# Patient Record
Sex: Male | Born: 1960 | Race: White | Hispanic: No | Marital: Married | Smoking: Never smoker
Health system: Southern US, Community
[De-identification: ages and names within clinical notes are randomized; demographics above are authoritative.]

## PROBLEM LIST (undated history)

## (undated) DIAGNOSIS — M79673 Pain in unspecified foot: Secondary | ICD-10-CM

## (undated) HISTORY — DX: Pain in unspecified foot: M79.673

---

## 2010-06-24 ENCOUNTER — Ambulatory Visit (INDEPENDENT_AMBULATORY_CARE_PROVIDER_SITE_OTHER): Payer: Commercial Managed Care - PPO | Admitting: Family Medicine

## 2010-06-24 ENCOUNTER — Encounter: Payer: Self-pay | Admitting: Family Medicine

## 2010-06-24 DIAGNOSIS — J020 Streptococcal pharyngitis: Secondary | ICD-10-CM

## 2010-06-24 DIAGNOSIS — Z1211 Encounter for screening for malignant neoplasm of colon: Secondary | ICD-10-CM

## 2010-06-29 NOTE — Assessment & Plan Note (Signed)
Summary: New Patient/dt   Vital Signs:  Patient profile:   50 year old male Height:      71.5 inches Weight:      180.50 pounds BMI:     24.91 O2 Sat:      99 % on Room air Temp:     97.7 degrees F oral Pulse rate:   67 / minute BP sitting:   115 / 74  (right arm) Cuff size:   large  Vitals Entered By: Francee Piccolo CMA Duncan Dull) (June 24, 2010 11:18 AM)  O2 Flow:  Room air CC: new to establish Is Patient Diabetic? No   History of Present Illness: 50 y/o WM here to establish care. Also had recent sore throat illness for a few days--all symptoms have resolved now. No cough or URI symptoms with it.  Subjective fever on first day or two.  Daughter had + strep pharyngitis last week.   He has been taking amoxil 500mg  three times a day for the last 3d.  Preventive Screening-Counseling & Management  Alcohol-Tobacco     Alcohol drinks/day: 0     Smoking Status: never  Current Medications (verified): 1)  Fish Oil 1000 Mg Caps (Omega-3 Fatty Acids) .... Take 1 Capsule By Mouth Once A Day  Allergies (verified): No Known Drug Allergies  Past History:  Past Medical History: No significant problems.  Past Surgical History: NONE  Family History: Dad: CVA, AAA MOM: A-fib, CHF, COPD, glaucoma  Social History: Married, 3 daughters. Occupation: Clinical research associate. No T/A/Ds.  Smoking Status:  never  Review of Systems  The patient denies anorexia, fever, weight loss, weight gain, vision loss, decreased hearing, hoarseness, chest pain, syncope, dyspnea on exertion, peripheral edema, prolonged cough, headaches, hemoptysis, abdominal pain, melena, hematochezia, severe indigestion/heartburn, hematuria, incontinence, genital sores, muscle weakness, suspicious skin lesions, transient blindness, difficulty walking, depression, unusual weight change, abnormal bleeding, enlarged lymph nodes, angioedema, breast masses, and testicular masses.    Physical Exam  General:  VS: noted, all  normal. Gen: Alert, well appearing, oriented x 4. HEENT: Scalp without lesions or hair loss.  Ears: EACs clear, normal epithelium.  TMs with good light reflex and landmarks bilaterally.  Eyes: no injection, icteris, swelling, or exudate.  EOMI, PERRLA. Nose: no drainage or turbinate edema/swelling.  No injection or focal lesion.  Mouth: lips without lesion/swelling.  Oral mucosa pink and moist.  Dentition intact and without obvious caries or gingival swelling.  Oropharynx without erythema, exudate, or swelling.  Neck: supple.  No lymphadenopathy, thyromegaly, or mass. Chest: symmetric expansion, with nonlabored respirations.  Clear and equal breath sounds in all lung fields.   CV: RRR, no m/r/g.  Peripheral pulses 2+/symmetric. EXT: no clubbing, cyanosis, or edema.     Impression & Recommendations:  Problem # 1:  STREPTOCOCCAL PHARYNGITIS (ICD-034.0) Assessment New Complete 10d course of abx.  His updated medication list for this problem includes:    Amoxicillin 875 Mg Tabs (Amoxicillin) .Marland Kitchen... 1 tab by mouth two times a day x 7d  Problem # 2:  SCREENING, COLON CANCER (ICD-V76.51) Assessment: New  Will arrange GI referral for routine screening.  Orders: Gastroenterology Referral (GI)  Complete Medication List: 1)  Fish Oil 1000 Mg Caps (Omega-3 fatty acids) .... Take 1 capsule by mouth once a day 2)  Amoxicillin 875 Mg Tabs (Amoxicillin) .Marland Kitchen.. 1 tab by mouth two times a day x 7d  Patient Instructions: 1)  Please arrange appt for CPE at your convenience. Prescriptions: AMOXICILLIN 875 MG TABS (AMOXICILLIN) 1  tab by mouth two times a day x 7d  #14 x 1   Entered and Authorized by:   Michell Heinrich M.D.   Signed by:   Michell Heinrich M.D. on 06/24/2010   Method used:   Electronically to        [facility]* (retail)       [address]       [address] [employer]       [place], [place]  [address]       Ph: [phone]       Fax:  [phone]   RxID:   [id number]    Orders Added: 1)  New Patient Level III [84132] 2)  Gastroenterology Referral [GI]

## 2010-07-28 ENCOUNTER — Ambulatory Visit (AMBULATORY_SURGERY_CENTER): Payer: 59 | Admitting: *Deleted

## 2010-07-28 VITALS — Ht 72.0 in | Wt 180.7 lb

## 2010-07-28 DIAGNOSIS — Z1211 Encounter for screening for malignant neoplasm of colon: Secondary | ICD-10-CM

## 2010-07-28 MED ORDER — PEG-KCL-NACL-NASULF-NA ASC-C 100 G PO SOLR: 1.0000 | Freq: Once | ORAL | Status: AC

## 2010-08-02 ENCOUNTER — Telehealth: Payer: Self-pay | Admitting: Gastroenterology

## 2010-08-02 ENCOUNTER — Encounter: Payer: Self-pay | Admitting: Gastroenterology

## 2010-08-02 NOTE — Telephone Encounter (Signed)
At 11:10am contacted patient and he stated that he had received the wrong prep from the pharmacy.  He has since received the right prep and is okay.

## 2010-08-02 NOTE — Telephone Encounter (Signed)
Patient wanted to make sure he had correct prep ordered.  Answered questions appropriately.

## 2010-08-03 ENCOUNTER — Ambulatory Visit (AMBULATORY_SURGERY_CENTER): Payer: 59 | Admitting: Gastroenterology

## 2010-08-03 ENCOUNTER — Other Ambulatory Visit: Payer: Commercial Managed Care - PPO | Admitting: Gastroenterology

## 2010-08-03 ENCOUNTER — Encounter: Payer: Self-pay | Admitting: Gastroenterology

## 2010-08-03 VITALS — BP 125/62 | HR 68 | Temp 96.8°F | Resp 18 | Ht 72.0 in | Wt 180.0 lb

## 2010-08-03 DIAGNOSIS — Z1211 Encounter for screening for malignant neoplasm of colon: Secondary | ICD-10-CM

## 2010-08-03 DIAGNOSIS — K648 Other hemorrhoids: Secondary | ICD-10-CM

## 2010-08-03 HISTORY — PX: COLONOSCOPY: SHX174

## 2010-08-03 NOTE — Patient Instructions (Signed)
Discharge instructions reviewed  Hemorrhoid education given  Next colonoscopy in 10 years  Please call if any problems

## 2010-08-03 NOTE — Progress Notes (Signed)
10:50 to bathroom to pass gas with success

## 2010-08-04 ENCOUNTER — Telehealth: Payer: Self-pay | Admitting: *Deleted

## 2010-08-04 NOTE — Telephone Encounter (Signed)

## 2010-09-06 ENCOUNTER — Encounter: Payer: Self-pay | Admitting: Family Medicine

## 2010-09-10 ENCOUNTER — Encounter: Payer: Self-pay | Admitting: Family Medicine

## 2010-12-15 NOTE — Progress Notes (Signed)
Addended by: Virgel Paling on: 12/15/2010 12:08 PM   Modules accepted: Level of Service

## 2011-01-19 ENCOUNTER — Other Ambulatory Visit (INDEPENDENT_AMBULATORY_CARE_PROVIDER_SITE_OTHER): Payer: 59

## 2011-01-19 ENCOUNTER — Telehealth: Payer: Self-pay | Admitting: *Deleted

## 2011-01-19 DIAGNOSIS — M545 Low back pain, unspecified: Secondary | ICD-10-CM

## 2011-01-19 DIAGNOSIS — R300 Dysuria: Secondary | ICD-10-CM

## 2011-01-19 LAB — URINALYSIS
Bilirubin Urine: NEGATIVE
Nitrite: NEGATIVE
Specific Gravity, Urine: 1.01 (ref 1.000–1.030)
Total Protein, Urine: NEGATIVE
pH: 6.5 (ref 5.0–8.0)

## 2011-01-19 LAB — BASIC METABOLIC PANEL
BUN: 16 mg/dL (ref 6–23)
Chloride: 103 mEq/L (ref 96–112)
Glucose, Bld: 89 mg/dL (ref 70–99)
Potassium: 4.4 mEq/L (ref 3.5–5.1)
Sodium: 139 mEq/L (ref 135–145)

## 2011-01-19 LAB — CBC WITH DIFFERENTIAL/PLATELET
Basophils Absolute: 0 10*3/uL (ref 0.0–0.1)
Basophils Relative: 0.3 % (ref 0.0–3.0)
Eosinophils Relative: 1.8 % (ref 0.0–5.0)
HCT: 44.3 % (ref 39.0–52.0)
Hemoglobin: 14.7 g/dL (ref 13.0–17.0)
Lymphocytes Relative: 27.7 % (ref 12.0–46.0)
Lymphs Abs: 2.4 10*3/uL (ref 0.7–4.0)
Monocytes Relative: 7.9 % (ref 3.0–12.0)
Neutro Abs: 5.4 10*3/uL (ref 1.4–7.7)
RBC: 4.84 Mil/uL (ref 4.22–5.81)
WBC: 8.7 10*3/uL (ref 4.5–10.5)

## 2011-01-19 LAB — PSA: PSA: 0.43 ng/mL (ref 0.10–4.00)

## 2011-01-19 NOTE — Telephone Encounter (Signed)
PC from pt c/o low back pain and dysuria.  Per Dr. Milinda Cave verbal OK given to order u/a, C&S, Renal, CBC, PSA.  DX back pain and dysuria.

## 2011-01-19 NOTE — Telephone Encounter (Signed)
Noted/agree--PM

## 2011-02-14 ENCOUNTER — Ambulatory Visit (INDEPENDENT_AMBULATORY_CARE_PROVIDER_SITE_OTHER): Payer: 59

## 2011-02-14 DIAGNOSIS — Z23 Encounter for immunization: Secondary | ICD-10-CM

## 2011-04-11 ENCOUNTER — Other Ambulatory Visit: Payer: Self-pay | Admitting: Family Medicine

## 2011-04-11 DIAGNOSIS — G4710 Hypersomnia, unspecified: Secondary | ICD-10-CM

## 2011-05-24 ENCOUNTER — Ambulatory Visit (INDEPENDENT_AMBULATORY_CARE_PROVIDER_SITE_OTHER): Payer: 59 | Admitting: Family Medicine

## 2011-05-24 ENCOUNTER — Encounter: Payer: Self-pay | Admitting: Family Medicine

## 2011-05-24 VITALS — BP 125/79 | HR 60 | Temp 97.9°F | Wt 181.0 lb

## 2011-05-24 DIAGNOSIS — G4733 Obstructive sleep apnea (adult) (pediatric): Secondary | ICD-10-CM

## 2011-05-24 DIAGNOSIS — J029 Acute pharyngitis, unspecified: Secondary | ICD-10-CM

## 2011-05-24 LAB — POCT RAPID STREP A (OFFICE): Rapid Strep A Screen: NEGATIVE

## 2011-05-24 MED ORDER — AMOXICILLIN-POT CLAVULANATE 875-125 MG PO TABS: 1.0000 | ORAL_TABLET | Freq: Two times a day (BID) | ORAL | Status: AC

## 2011-05-24 MED ORDER — PREDNISONE 20 MG PO TABS: ORAL_TABLET | ORAL | Status: DC

## 2011-05-24 NOTE — Progress Notes (Signed)
OFFICE NOTE  05/24/2011  CC:  Chief Complaint  Patient presents with  . Sore Throat     HPI: Patient is a 51 y.o. Caucasian male who is here for sore throat. Pt presents complaining of respiratory symptoms for 1-2  days.  Mostly nasal congestion/runny nose, sore throat, woke up with feeling of strangling in throat this am and ST worse.  Worst symptoms seems to be the ST.  Lately the symptoms seem to be worsening.  No itching. No fevers, no wheezing, and no SOB.  No pain in face or teeth.  NO HA. Symptoms made worse by nothing.  Symptoms improved by nothing. Smoker? no Recent sick contact? Yes, daughter sick last week with fever/resp illness. Muscle or joint aches? Mild, with mild fatigue. Flu shot this season at least 2 wks ago? yes  ROS: no n/v/d or abdominal pain.  No rash.  No neck stiffness.   +Mild fatigue.  +Mild appetite loss.  He did have a FLI around christmas time, had a brief post-viral exanthem after this illness.  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  MEDS:  NO MEDS RECENTLY Outpatient Prescriptions Prior to Visit  Medication Sig Dispense Refill  . Omega-3 Fatty Acids (FISH OIL) 1000 MG CAPS Take 1 capsule by mouth daily.          PE: Blood pressure 125/79, pulse 60, temperature 97.9 F (36.6 C), temperature source Temporal, weight 181 lb (82.101 kg), SpO2 99.00%. VS: noted--normal. Gen: alert, NAD, NONTOXIC APPEARING. HEENT: eyes without injection, drainage, or swelling.  Ears: EACs clear, TMs with normal light reflex and landmarks.  Nose: Clear rhinorrhea, with some dried, crusty exudate adherent to mildly injected mucosa.  No purulent d/c.  No paranasal sinus TTP.  No facial swelling.  Throat and mouth without focal lesion.   Marked uvula swelling and erythema, tonsils avg size, symmetric, mild erythema.  Neck: supple, mild nontender jugulodigastric LAD.   LUNGS: CTA bilat, nonlabored resps.   CV:  RRR, no m/r/g. EXT: no c/c/e SKIN: no rash  LAB: rapid strep neg  IMPRESSION AND PLAN: Acute pharyngitis, marked uvular edema with patient having sense of strangulation/hot potato voice. Strep neg.  Sent culture. Empiric augmentin 875mg  bid x 10d. Empiric prednisone 60mg  qd x 2d, then 40mg  qd x 2d, then 20mg  qd x 2d. Antihistamines and NSAIDs discussed.  Will arrange home unattended sleep study to further evaluate his witnessed sleep apnea.  FOLLOW UP: prn

## 2011-05-24 NOTE — Progress Notes (Signed)
Addended by: Baldemar Lenis R on: 05/24/2011 09:30 AM   Modules accepted: Orders

## 2011-05-26 LAB — CULTURE, GROUP A STREP: Organism ID, Bacteria: NORMAL

## 2011-06-21 ENCOUNTER — Encounter: Payer: Self-pay | Admitting: Family Medicine

## 2011-09-28 ENCOUNTER — Ambulatory Visit (INDEPENDENT_AMBULATORY_CARE_PROVIDER_SITE_OTHER): Payer: 59 | Admitting: Family Medicine

## 2011-09-28 ENCOUNTER — Encounter: Payer: Self-pay | Admitting: Family Medicine

## 2011-09-28 VITALS — BP 114/78 | HR 60 | Temp 97.9°F | Wt 185.0 lb

## 2011-09-28 DIAGNOSIS — L989 Disorder of the skin and subcutaneous tissue, unspecified: Secondary | ICD-10-CM

## 2011-09-28 NOTE — Progress Notes (Signed)
OFFICE NOTE  09/28/2011  CC:  Chief Complaint  Patient presents with  . rough area on face     HPI: Patient is a 51 y.o. Caucasian male who is here for spot on face.  Has been there x approx 6 mo, seems to come and go, describes it as slightly flaky surface lesion approx dime sized at the most. Nothing there currently.  No itching or pain.  NO hx of skin malignancy or similar skin lesion in the past.   Pertinent PMH:  History reviewed. No pertinent past medical history.  MEDS: Omega 3 fatty acids 1000mg   PE: Blood pressure 114/78, pulse 60, temperature 97.9 F (36.6 C), temperature source Temporal, weight 185 lb (83.915 kg). Gen: Alert, well appearing.  Patient is oriented to person, place, time, and situation. Skin: face with no worrisom lesions, no hyperkeratosis, no hypervascularity, no hypertrophy of skin, no comedonal or pustular lesions.  Benign-appearing flesh colored nevus on lateral right cheek about 2 mm in size  IMPRESSION AND PLAN: Facial skin lesion, possibly actinic keratosis. Nothing visible there today. Plan is to continue monitoring for now.  Consideration given to topical agent like solaraze gel (diclofenac 3%) for 90d vs imiquimod vs cryotherapy if lesion returns more prominently in future. FOLLOW UP: prn

## 2012-02-15 ENCOUNTER — Ambulatory Visit (INDEPENDENT_AMBULATORY_CARE_PROVIDER_SITE_OTHER): Payer: 59

## 2012-02-15 DIAGNOSIS — Z23 Encounter for immunization: Secondary | ICD-10-CM

## 2013-03-16 ENCOUNTER — Ambulatory Visit (INDEPENDENT_AMBULATORY_CARE_PROVIDER_SITE_OTHER): Payer: 59 | Admitting: Family Medicine

## 2013-03-16 DIAGNOSIS — Z23 Encounter for immunization: Secondary | ICD-10-CM

## 2013-06-04 ENCOUNTER — Ambulatory Visit: Payer: 59 | Admitting: Family Medicine

## 2013-06-07 ENCOUNTER — Ambulatory Visit (INDEPENDENT_AMBULATORY_CARE_PROVIDER_SITE_OTHER): Payer: 59 | Admitting: Family Medicine

## 2013-06-07 ENCOUNTER — Encounter: Payer: Self-pay | Admitting: Family Medicine

## 2013-06-07 VITALS — BP 118/84 | HR 78 | Temp 98.8°F | Resp 18 | Ht 71.75 in | Wt 176.0 lb

## 2013-06-07 DIAGNOSIS — Z0000 Encounter for general adult medical examination without abnormal findings: Secondary | ICD-10-CM

## 2013-06-07 DIAGNOSIS — Z0389 Encounter for observation for other suspected diseases and conditions ruled out: Secondary | ICD-10-CM

## 2013-06-07 NOTE — Assessment & Plan Note (Signed)
Reviewed age and gender appropriate health maintenance issues (prudent diet, regular exercise, health risks of tobacco and excessive alcohol, use of seatbelts, fire alarms in home, use of sunscreen).  Also reviewed age and gender appropriate health screening as well as vaccine recommendations. Health panel labs + screening PSA ordered today--future/[facility]--since he was not fasting today. DRE normal today.   Discussed stress-relief measures, mainly exercise, and also reviewed possible medication options for this.  He declined meds at this time.

## 2013-06-07 NOTE — Progress Notes (Signed)
Pre visit review using our clinic review tool, if applicable. No additional management support is needed unless otherwise documented below in the visit note. 

## 2013-06-07 NOTE — Progress Notes (Signed)
Office Note 06/07/2013  CC:  Chief Complaint  Patient presents with  . Stress    HPI:  Brian Lynch is a 53 y.o. White male who is here for CPE. Describes excessive stress level, working constantly, feeling like he is being distant at home more around family --daughter commented that he was acting this way and was "different".  He denies feeling sad or depressed. No panic or rage.  Sleep: about 6 hours a night but not restful. Exercise sounds minimal due to time constraints.   History reviewed. No pertinent past medical history.  Past Surgical History  Procedure Laterality Date  . Colonoscopy  08/03/10    Normal (small internal hemorrhoids).  Repeat in 10 yrs.    Family History  Problem Relation Age of Onset  . Atrial fibrillation Mother   . Heart failure Mother   . COPD Mother   . Glaucoma Mother   . Stroke Father   . Aneurysm Father     AAA    History   Social History  . Marital Status: Married    Spouse Name: [name]    Number of Children: 3  . Years of Education: N/A   Occupational History  . Writer    Social History Main Topics  . Smoking status: Never Smoker   . Smokeless tobacco: Never Used  . Alcohol Use: 2.5 oz/week    5 drink(s) per week  . Drug Use: No  . Sexual Activity: Not on file   Other Topics Concern  . Not on file   Social History Narrative   Married, 3 children.   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   No Tob, alc, or drugs.    Outpatient Prescriptions Prior to Visit  Medication Sig Dispense Refill  . Omega-3 Fatty Acids (FISH OIL) 1000 MG CAPS Take 1 capsule by mouth daily.         No facility-administered medications prior to visit.    No Known Allergies  ROS Review of Systems  Constitutional: Negative for fever, chills, appetite change and fatigue.  HENT: Negative for congestion, dental problem, ear pain and sore throat.   Eyes: Negative for discharge, redness and visual disturbance.  Respiratory: Negative  for cough, chest tightness, shortness of breath and wheezing.   Cardiovascular: Negative for chest pain, palpitations and leg swelling.  Gastrointestinal: Negative for nausea, vomiting, abdominal pain, diarrhea and blood in stool.  Genitourinary: Negative for dysuria, urgency, frequency, hematuria, flank pain and difficulty urinating.  Musculoskeletal: Negative for arthralgias, back pain, joint swelling, myalgias and neck stiffness.  Skin: Negative for pallor and rash.  Neurological: Negative for dizziness, speech difficulty, weakness and headaches.  Hematological: Negative for adenopathy. Does not bruise/bleed easily.  Psychiatric/Behavioral: Negative for confusion and sleep disturbance. The patient is not nervous/anxious.      PE; Blood pressure 118/84, pulse 78, temperature 98.8 F (37.1 C), temperature source Temporal, resp. rate 18, height 5' 11.75" (1.822 m), weight 176 lb (79.833 kg), SpO2 100.00%. BMI 24 Gen: Alert, well appearing.  Patient is oriented to person, place, time, and situation. AFFECT: pleasant, lucid thought and speech. ENT: Ears: EACs clear, normal epithelium.  TMs with good light reflex and landmarks bilaterally.  Eyes: no injection, icteris, swelling, or exudate.  EOMI, PERRLA. Nose: no drainage or turbinate edema/swelling.  No injection or focal lesion.  Mouth: lips without lesion/swelling.  Oral mucosa pink and moist.  Dentition intact and without obvious caries or gingival swelling.  Oropharynx without erythema, exudate, or swelling.  Neck: supple/nontender.  No LAD, mass, or TM.  Carotid pulses 2+ bilaterally, without bruits. CV: RRR, no m/r/g.   LUNGS: CTA bilat, nonlabored resps, good aeration in all lung fields. ABD: soft, NT, ND, BS normal.  No hepatospenomegaly or mass.  No bruits. EXT: no clubbing, cyanosis, or edema.  Musculoskeletal: no joint swelling, erythema, warmth, or tenderness.  ROM of all joints intact. Skin - no sores or suspicious lesions or  rashes or color changes Rectal exam: negative without mass, lesions or tenderness.  Prostate is smooth, without enlargement or nodule or asymmetry.  No tenderness.  Pertinent labs:  None today  ASSESSMENT AND PLAN:   Health maintenance examination Reviewed age and gender appropriate health maintenance issues (prudent diet, regular exercise, health risks of tobacco and excessive alcohol, use of seatbelts, fire alarms in home, use of sunscreen).  Also reviewed age and gender appropriate health screening as well as vaccine recommendations. Health panel labs + screening PSA ordered today--[facility]--since he was not fasting today. DRE normal today.   Discussed stress-relief measures, mainly exercise, and also reviewed possible medication options for this.  He declined meds at this time.    An After Visit Summary was printed and given to the patient.  FOLLOW UP:  Return in about 1 year (around 06/07/2014) for annual CPE (fasting).

## 2013-10-31 ENCOUNTER — Other Ambulatory Visit: Payer: Self-pay | Admitting: Family Medicine

## 2013-10-31 MED ORDER — PERMETHRIN 1 % EX LIQD: Freq: Once | CUTANEOUS | Status: DC

## 2014-01-01 ENCOUNTER — Ambulatory Visit (INDEPENDENT_AMBULATORY_CARE_PROVIDER_SITE_OTHER)
Admission: RE | Admit: 2014-01-01 | Discharge: 2014-01-01 | Disposition: A | Discharge status: Home / Self Care | Payer: 59 | Source: Ambulatory Visit | Attending: Family Medicine | Admitting: Family Medicine

## 2014-01-01 ENCOUNTER — Other Ambulatory Visit (INDEPENDENT_AMBULATORY_CARE_PROVIDER_SITE_OTHER): Payer: 59

## 2014-01-01 ENCOUNTER — Encounter: Payer: Self-pay | Admitting: Family Medicine

## 2014-01-01 ENCOUNTER — Ambulatory Visit (INDEPENDENT_AMBULATORY_CARE_PROVIDER_SITE_OTHER): Payer: 59 | Admitting: Family Medicine

## 2014-01-01 VITALS — BP 112/82 | HR 64 | Ht 73.0 in | Wt 183.0 lb

## 2014-01-01 DIAGNOSIS — M25519 Pain in unspecified shoulder: Secondary | ICD-10-CM

## 2014-01-01 DIAGNOSIS — M25512 Pain in left shoulder: Secondary | ICD-10-CM

## 2014-01-01 DIAGNOSIS — M999 Biomechanical lesion, unspecified: Secondary | ICD-10-CM

## 2014-01-01 DIAGNOSIS — M542 Cervicalgia: Secondary | ICD-10-CM | POA: Insufficient documentation

## 2014-01-01 MED ORDER — PREDNISONE 50 MG PO TABS: 50.0000 mg | ORAL_TABLET | Freq: Every day | ORAL | Status: DC

## 2014-01-01 MED ORDER — CYCLOBENZAPRINE HCL 10 MG PO TABS: 10.0000 mg | ORAL_TABLET | Freq: Three times a day (TID) | ORAL | Status: DC | PRN

## 2014-01-01 NOTE — Patient Instructions (Addendum)
Good to see you Xrays downstairs today Prednsoine daily for 5 days Flexeril for breakthrough pain.  Ice 20 minutes 2 times daily.  Work on sleep positioning keep ing arm down and avoid fan on you. Hopefully manipulation help Start these exercises in 48 hhours.  No basketball until next week.  Come back in 2 weeks. If worse call me [phone]

## 2014-01-01 NOTE — Progress Notes (Signed)
  [name] [place], [place] [address] Phone: [phone] Subjective:     CC: Left shoulder pain  [id number] Brian Lynch is a 53 y.o. male coming in with complaint of left shoulder pain. Patient states that this started approximately 2 weeks ago. Patient states since then it seems to be getting somewhat worse. Patient does play basketball on a fairly regular basis and states that he has worsening exacerbation after getting hit on the top of his shoulder. Patient describes the pain as a dull aching sensation over all the can have a sharp pain with certain motions especially reaching down. She has discomfort at night and has woken him up before. Patient has been trying ibuprofen and does take some of the pain away. Patient was in severity a 6/10. Denies any radiation down the arm or any numbness. Patient states more of a dull throbbing and even a burning sensation that can also go go towards his neck.     Past medical history, social, surgical and family history all reviewed in electronic medical record.   Review of Systems: No headache, visual changes, nausea, vomiting, diarrhea, constipation, dizziness, abdominal pain, skin rash, fevers, chills, night sweats, weight loss, swollen lymph nodes, body aches, joint swelling, muscle aches, chest pain, shortness of breath, mood changes.   Objective Blood pressure 112/82, pulse 64, height 6\' 1"  (1.854 m), weight 183 lb (83.008 kg), SpO2 95.00%.  General: No apparent distress alert and oriented x3 mood and affect normal, dressed appropriately.  HEENT: Pupils equal, extraocular movements intact  Respiratory: Patient's speak in full sentences and does not appear short of breath  Cardiovascular: No lower extremity edema, non tender, no erythema  Skin: Warm dry intact with no signs of infection or rash on extremities or on axial skeleton.  Abdomen: Soft nontender  Neuro: Cranial nerves II through XII are  intact, neurovascularly intact in all extremities with 2+ DTRs and 2+ pulses.  Lymph: No lymphadenopathy of posterior or anterior cervical chain or axillae bilaterally.  Gait normal with good balance and coordination.  MSK:  Non tender with full range of motion and good stability and symmetric strength and tone of  elbows, wrist, hip, knee and ankles bilaterally.   Neck: Inspection unremarkable. Patient is tender to palpation in the paraspinal musculature of the cervical and thoracic spine down to T8 No palpable stepoffs. Positive Spurling's maneuver. Full neck range of motion Grip strength and sensation normal in bilateral hands Strength good C4 to T1 distribution No sensory change to C4 to T1 Negative Hoffman sign bilaterally Reflexes normal  Shoulder: Left Inspection reveals no abnormalities, atrophy or asymmetry. Palpation is normal with no tenderness over AC joint or bicipital groove. ROM is full in all planes. Rotator cuff strength normal throughout. No signs of impingement with negative Neer and Hawkin's tests, empty can sign. Speeds and Yergason's tests normal. No labral pathology noted with negative Obrien's, negative clunk and good stability. Normal scapular function observed. No painful arc and no drop arm sign. No apprehension sign Contralateral shoulder unremarkable  Osteopathic findings Elevated first rib on left    Impression and Recommendations:     This case required medical decision making of moderate complexity.

## 2014-01-01 NOTE — Assessment & Plan Note (Signed)
Patient does have neck pain overall. I am concerned the patient shoulder pain is radicular symptoms from his neck with a positive Spurling status. X-rays were ordered today for further evaluation. Patient did also have an elevated first rib which patient responded very well to osteopathic manipulation. Patient was given some home exercises that I think will be beneficial and was given prednisone to see if that'll help with any inflammation. Patient was given a prescription for a muscle relaxer for any breakthrough pain. We discussed an icing regimen patient is going to follow up again in 2 weeks for further evaluation and treatment.

## 2014-01-15 ENCOUNTER — Encounter: Payer: Self-pay | Admitting: Family Medicine

## 2014-01-15 ENCOUNTER — Ambulatory Visit (INDEPENDENT_AMBULATORY_CARE_PROVIDER_SITE_OTHER): Payer: 59 | Admitting: Family Medicine

## 2014-01-15 VITALS — BP 114/80 | HR 59 | Ht 73.0 in | Wt 185.0 lb

## 2014-01-15 DIAGNOSIS — M542 Cervicalgia: Secondary | ICD-10-CM

## 2014-01-15 MED ORDER — GABAPENTIN 100 MG PO CAPS: 100.0000 mg | ORAL_CAPSULE | Freq: Three times a day (TID) | ORAL | Status: DC

## 2014-01-15 NOTE — Patient Instructions (Signed)
Good to see you Gabapentin 100mg  at night for first week then 200mg  next week nightly then 300mg  thereafter.  If any grogginess in morning can stay at lower dose.  Continue the exercises  On wall heels, butt, shoulders and head touching goal of 5 minutes daily Computer monitor at eye level and elbows at 90 degrees Duct tape tennis ball to chair between shoulder blades and have in contact with back while sitting Basketball is OK 2 times a week.  VitaminD 2000 IU daily Lets check in 3 weeks.

## 2014-01-15 NOTE — Assessment & Plan Note (Signed)
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 Patient is able to do activities of daily living which I think will be but we will continue to monitor to make sure patient is progressing in the right direction. Patient was given gabapentin and we will titrate up accordingly. We will help with his sleep as well. Patient will start the exercises on a regular basis we discussed the possibility of formal physical therapy. Patient could try osteopathic manipulation but I think we will like to avoid it due to the severity of the amount of arthritis in the neck. We could try more indirect techniques if necessary. We discussed the importance of partial exercises and showed proper technique. We discussed an icing regimen I can be helpful as well. Patient will follow up with me again in 4 weeks for further evaluation and treatment.

## 2014-01-15 NOTE — Progress Notes (Signed)
  [facility] [address] [place], [place] [address] Phone: [phone] Subjective:     CC: Left shoulder pain, cervical radiculopathy  [id number] Brian Lynch is a 53 y.o. male coming in with complaint of left shoulder pain. There is concern the patient was having cervical radiculopathy. Patient did have x-rays addition severe degenerative changes most specifically at C3-4, 45, that could be contributing to his pain. Patient was given prednisone had significant improvement almost immediately. Patient states that he did have about a 90% improvement and then started to get worse again. Patient notices when he moves his neck a certain way he can radicular symptoms going down the arm but denies any weakness. Patient states it is not stopping him from his daily activities but is uncomfortable. Patient has taken any other medications on an as-needed basis. Patient has continued to play basketball and is doing relatively well. Patient states he is about 50% better now that he has been off of the prednisone for the last 3 days.     Past medical history, social, surgical and family history all reviewed in electronic medical record.   Review of Systems: No headache, visual changes, nausea, vomiting, diarrhea, constipation, dizziness, abdominal pain, skin rash, fevers, chills, night sweats, weight loss, swollen lymph nodes, body aches, joint swelling, muscle aches, chest pain, shortness of breath, mood changes.   Objective Blood pressure 114/80, pulse 59, height 6\' 1"  (1.854 m), weight 185 lb (83.915 kg), SpO2 97.00%.  General: No apparent distress alert and oriented x3 mood and affect normal, dressed appropriately.  HEENT: Pupils equal, extraocular movements intact  Respiratory: Patient's speak in full sentences and does not appear short of breath  Cardiovascular: No lower extremity edema, non tender, no erythema  Skin: Warm dry intact with no signs of infection or  rash on extremities or on axial skeleton.  Abdomen: Soft nontender  Neuro: Cranial nerves II through XII are intact, neurovascularly intact in all extremities with 2+ DTRs and 2+ pulses.  Lymph: No lymphadenopathy of posterior or anterior cervical chain or axillae bilaterally.  Gait normal with good balance and coordination.  MSK:  Non tender with full range of motion and good stability and symmetric strength and tone of  elbows, wrist, hip, knee and ankles bilaterally.   Neck: Inspection unremarkable. Patient is not as tender as he was previously but still some mild periscapular tenderness of the musculature. No palpable stepoffs. Positive Spurling's maneuver still present Full neck range of motion Grip strength and sensation normal in bilateral hands Strength good C4 to T1 distribution No sensory change to C4 to T1 Negative Hoffman sign bilaterally Reflexes normal  Shoulder: Left Inspection reveals no abnormalities, atrophy or asymmetry. Palpation is normal with no tenderness over AC joint or bicipital groove. ROM is full in all planes. Rotator cuff strength normal throughout. No signs of impingement with negative Neer and Hawkin's tests, empty can sign. Speeds and Yergason's tests normal. No labral pathology noted with negative Obrien's, negative clunk and good stability. Normal scapular function observed. No painful arc and no drop arm sign. No apprehension sign Contralateral shoulder unremarkable    Impression and Recommendations:     This case required medical decision making of moderate complexity.

## 2014-02-06 ENCOUNTER — Ambulatory Visit (INDEPENDENT_AMBULATORY_CARE_PROVIDER_SITE_OTHER): Payer: 59 | Admitting: Family Medicine

## 2014-02-06 ENCOUNTER — Encounter: Payer: Self-pay | Admitting: Family Medicine

## 2014-02-06 VITALS — BP 126/74 | HR 73 | Ht 72.0 in | Wt 188.0 lb

## 2014-02-06 DIAGNOSIS — M999 Biomechanical lesion, unspecified: Secondary | ICD-10-CM

## 2014-02-06 DIAGNOSIS — M9901 Segmental and somatic dysfunction of cervical region: Secondary | ICD-10-CM

## 2014-02-06 DIAGNOSIS — M542 Cervicalgia: Secondary | ICD-10-CM

## 2014-02-06 DIAGNOSIS — M9902 Segmental and somatic dysfunction of thoracic region: Secondary | ICD-10-CM

## 2014-02-06 DIAGNOSIS — M9908 Segmental and somatic dysfunction of rib cage: Secondary | ICD-10-CM

## 2014-02-06 DIAGNOSIS — Z23 Encounter for immunization: Secondary | ICD-10-CM

## 2014-02-06 NOTE — Patient Instructions (Signed)
Continue the natural medicines for now Continue the posture exercises.  See me in 5-6 weeks.

## 2014-02-06 NOTE — Assessment & Plan Note (Signed)
Decision today to treat with OMT was based on Physical Exam  After verbal consent patient was treated with HVLA, ME, FPR techniques in cervical, thoracic and lumbar and rib areas  Patient tolerated the procedure well with improvement in symptoms  Patient given exercises, stretches and lifestyle modifications  See medications in patient instructions if given  Patient will follow up in 3-4 weeks

## 2014-02-06 NOTE — Assessment & Plan Note (Signed)
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 Patient though did respond very well manipulation therapy previously and now it became a little bit more aggressive. Patient did have good resolution pain today. Discuss continuing the over-the-counter medications if he is not going to continue to prescription medication. Patient given a prescription for cervical traction to try home. Patient and will come back and see me again in 3-4 weeks for further evaluation and treatment.

## 2014-02-06 NOTE — Progress Notes (Signed)
[facility] [address] [place], [place] [address] Phone: [phone] Subjective:     CC: Left shoulder pain, cervical radiculopathy  [id number] Brian Lynch is a 53 y.o. male coming in with complaint of left shoulder pain. There is concern the patient was having cervical radiculopathy. Patient did have x-rays addition severe degenerative changes most specifically at C3-4, 45, that could be contributing to Brian pain. Patient was given prednisone had significant improvement almost immediately. Patient states that he did have about a 90% improvement and then started to get worse again. Patient was to take gabapentin but I was told by Brian Lynch recently he has not taken her medicine regularly.. Patient does do some of the exercises on a regular basis. Patient though is able to enjoy Brian daily activities as well as play basketball 2-3 times a week. Patient denies any new symptoms or any significant worsening of previous symptoms.     Past medical history, social, surgical and family history all reviewed in electronic medical record.   Review of Systems: No headache, visual changes, nausea, vomiting, diarrhea, constipation, dizziness, abdominal pain, skin rash, fevers, chills, night sweats, weight loss, swollen lymph nodes, body aches, joint swelling, muscle aches, chest pain, shortness of breath, mood changes.   Objective Blood pressure 126/74, pulse 73, height 6' (1.829 m), weight 188 lb (85.276 kg), SpO2 98.00%.  General: No apparent distress alert and oriented x3 mood and affect normal, dressed appropriately.  HEENT: Pupils equal, extraocular movements intact  Respiratory: Patient's speak in full sentences and does not appear short of breath  Cardiovascular: No lower extremity edema, non tender, no erythema  Skin: Warm dry intact with no signs of infection or rash on extremities or on axial skeleton.  Abdomen: Soft nontender  Neuro: Cranial nerves II through  XII are intact, neurovascularly intact in all extremities with 2+ DTRs and 2+ pulses.  Lymph: No lymphadenopathy of posterior or anterior cervical chain or axillae bilaterally.  Gait normal with good balance and coordination.  MSK:  Non tender with full range of motion and good stability and symmetric strength and tone of  elbows, wrist, hip, knee and ankles bilaterally.   Neck: Inspection unremarkable. Patient is not as tender as he was previously but still some mild periscapular tenderness of the musculature. No palpable stepoffs. Still pain with Spurling's maneuver but no radicular symptoms Flexed last 5 of rotation. Mostly to the left Grip strength and sensation normal in bilateral hands Strength good C4 to T1 distribution No sensory change to C4 to T1 Negative Hoffman sign bilaterally Reflexes normal  Shoulder: Left Inspection reveals no abnormalities, atrophy or asymmetry. Palpation is normal with no tenderness over AC joint or bicipital groove. ROM is full in all planes. Rotator cuff strength normal throughout. No signs of impingement with negative Neer and Hawkin's tests, empty can sign. Speeds and Yergason's tests normal. No labral pathology noted with negative Obrien's, negative clunk and good stability. Normal scapular function observed. No painful arc and no drop arm sign. No apprehension sign Contralateral shoulder unremarkable  OMT Physical Exam   Cervical  C2 flexed rotated and side bent left C4 flexed rotated and side bent right  Thoracic T3 extended rotated and side bent right elevated third rib  T5 extended rotated and side bent left  Lumbar L2 flexed rotated and side bent right Sacrum  Illium       Impression and Recommendations:     This case required medical decision making of  moderate complexity.

## 2014-03-20 ENCOUNTER — Ambulatory Visit: Payer: 59 | Admitting: Family Medicine

## 2015-02-09 ENCOUNTER — Telehealth: Payer: Self-pay | Admitting: Family Medicine

## 2015-02-09 ENCOUNTER — Telehealth: Payer: Self-pay | Admitting: Family

## 2015-02-09 DIAGNOSIS — S058X1A Other injuries of right eye and orbit, initial encounter: Secondary | ICD-10-CM

## 2015-02-09 NOTE — Telephone Encounter (Signed)
Caller name: Diane   Relationship to patient:  [name]   Can be reached: [phone]  (fax) [phone]   Reason for call: She called in because she says that she need provider notes from [place] to give to her Dr. (referred Dr.) She says that [facility] referred this pt to her office. She is requesting that these notes are faxed to number provider above.

## 2015-02-09 NOTE — Telephone Encounter (Signed)
Spoke with Diane and explained that pt was not seen in our office, spoke with PCP on phone and referral was placed. No further requests at this time.

## 2015-02-09 NOTE — Telephone Encounter (Signed)
Pt contacted the office with c/o trauma to the right eye today playing basketball. + pain, no visual changes.  Will arrange consult with opthalmology.

## 2015-03-02 ENCOUNTER — Ambulatory Visit (INDEPENDENT_AMBULATORY_CARE_PROVIDER_SITE_OTHER): Payer: 59

## 2015-03-02 DIAGNOSIS — Z23 Encounter for immunization: Secondary | ICD-10-CM | POA: Diagnosis not present

## 2015-03-02 NOTE — Progress Notes (Signed)
Pre visit review using our clinic review tool, if applicable. No additional management support is needed unless otherwise documented below in the visit note. 

## 2015-04-15 ENCOUNTER — Encounter: Payer: Self-pay | Admitting: Family Medicine

## 2015-04-15 ENCOUNTER — Ambulatory Visit (INDEPENDENT_AMBULATORY_CARE_PROVIDER_SITE_OTHER): Payer: 59 | Admitting: Family Medicine

## 2015-04-15 ENCOUNTER — Other Ambulatory Visit (INDEPENDENT_AMBULATORY_CARE_PROVIDER_SITE_OTHER): Payer: 59

## 2015-04-15 VITALS — BP 112/68 | HR 84 | Ht 72.0 in | Wt 190.0 lb

## 2015-04-15 DIAGNOSIS — M216X1 Other acquired deformities of right foot: Secondary | ICD-10-CM

## 2015-04-15 DIAGNOSIS — M25571 Pain in right ankle and joints of right foot: Secondary | ICD-10-CM | POA: Diagnosis not present

## 2015-04-15 DIAGNOSIS — M2580 Other specified joint disorders, unspecified joint: Secondary | ICD-10-CM | POA: Diagnosis not present

## 2015-04-15 MED ORDER — MELOXICAM 15 MG PO TABS: 15.0000 mg | ORAL_TABLET | Freq: Every day | ORAL | Status: DC

## 2015-04-15 NOTE — Patient Instructions (Signed)
Have a great trip See you Friday [employer] orthotics "total support" online Brian Lynch is great) Ice 20 minutes 2 times daily. Usually after activity and before bed. Exercises 3 times a week.  Good shoes with rigid bottom.  [name], Merrell or New balance greater then 700 Meloxicam daily for next 2 weeks and while in [place] then as needed See me again in 4 weeks to make sure better.  We may need custom orthotics in the long run Happy holidays!

## 2015-04-15 NOTE — Progress Notes (Signed)
  [facility] [address] [place], [place] [address] Phone: [phone] Subjective:     CC: Right foot pain  [id number] Brian Lynch is a 54 y.o. male coming in with complaint of right foot pain. Patient noticed this after playing basketball on a more regular basis. Patient states that most the pain seems to be on the forefoot. Patient states it is more of a dull, throbbing aching sensation. Patient is going to be going out of the country very soon and is concerned because he will be doing a lot of walking. Patient denies any numbness. Does not remember any true injury itself. Patient has not change shoes recently. Denies any swelling. Denies any redness. Rates the discomfort though is 5 out of 10.    Past medical history, social, surgical and family history all reviewed in electronic medical record.   Review of Systems: No headache, visual changes, nausea, vomiting, diarrhea, constipation, dizziness, abdominal pain, skin rash, fevers, chills, night sweats, weight loss, swollen lymph nodes, body aches, joint swelling, muscle aches, chest pain, shortness of breath, mood changes.   Objective Blood pressure 112/68, pulse 84, height 6' (1.829 m), weight 190 lb (86.183 kg), SpO2 96 %.  General: No apparent distress alert and oriented x3 mood and affect normal, dressed appropriately.  HEENT: Pupils equal, extraocular movements intact  Respiratory: Patient's speak in full sentences and does not appear short of breath  Cardiovascular: No lower extremity edema, non tender, no erythema  Skin: Warm dry intact with no signs of infection or rash on extremities or on axial skeleton.  Abdomen: Soft nontender  Neuro: Cranial nerves II through XII are intact, neurovascularly intact in all extremities with 2+ DTRs and 2+ pulses.  Lymph: No lymphadenopathy of posterior or anterior cervical chain or axillae bilaterally.  Gait normal with good balance and coordination.    MSK:  Non tender with full range of motion and good stability and symmetric strength and tone of shoulders, elbows, wrist, hip, knee and bilaterally.  Patient's foot exam shows the patient does have severe breakdown of the transverse arch on the right side compared to the left side. Patient does have callus formation on the plantar aspect of the foot over the second toe. Patient does have hammertoe with mild overlapping of the first MTP. 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 Patient is tender to palpation mostly on the plantar aspect of the second metatarsal over the sesamoid bone. This seems to be an accessory bone. Neurovascularly intact distally with full strength.  Limited musculoskeletal ultrasound was performed and interpreted by Hulan Saas, M  Limited ultrasound the patient's right foot shows the patient does have what appears to be a small amount of sesamoiditis as well as the flexor tendon tendinitis with hypoechoic changes within the tendon sheath. Mild hypoechoic changes of the bone itself but no cortical defect noted. No thickening of the bone. No cortical fractures noted. Impression: Sesamoiditis with a flexor tendinitis on the plantar aspect of the foot.   Impression and Recommendations:     This case required medical decision making of moderate complexity.

## 2015-04-15 NOTE — Progress Notes (Signed)
Pre visit review using our clinic review tool, if applicable. No additional management support is needed unless otherwise documented below in the visit note. 

## 2015-04-15 NOTE — Assessment & Plan Note (Signed)
Discussed with patient at great length. We discussed home exercises, over-the-counter orthotics, icing as well as oral anti-inflammatories. Patient is going out of the country and will be doing a lot of walking. We discussed proper shoe choices. We discussed sizing in shoes as well as augmentation is lacing. Patient will try to make these changes and come back and see me again in 4 weeks. Patient may need custom orthotics in the long run.

## 2015-05-13 ENCOUNTER — Ambulatory Visit: Payer: 59 | Admitting: Family Medicine

## 2015-09-21 ENCOUNTER — Encounter: Payer: Self-pay | Admitting: Sports Medicine

## 2015-09-21 ENCOUNTER — Ambulatory Visit (INDEPENDENT_AMBULATORY_CARE_PROVIDER_SITE_OTHER): Payer: 59 | Admitting: Sports Medicine

## 2015-09-21 ENCOUNTER — Ambulatory Visit (INDEPENDENT_AMBULATORY_CARE_PROVIDER_SITE_OTHER): Payer: 59

## 2015-09-21 DIAGNOSIS — M7741 Metatarsalgia, right foot: Secondary | ICD-10-CM | POA: Diagnosis not present

## 2015-09-21 DIAGNOSIS — M216X9 Other acquired deformities of unspecified foot: Secondary | ICD-10-CM

## 2015-09-21 DIAGNOSIS — M7742 Metatarsalgia, left foot: Secondary | ICD-10-CM

## 2015-09-21 DIAGNOSIS — M792 Neuralgia and neuritis, unspecified: Secondary | ICD-10-CM | POA: Diagnosis not present

## 2015-09-21 DIAGNOSIS — M79673 Pain in unspecified foot: Secondary | ICD-10-CM

## 2015-09-21 MED ORDER — TRIAMCINOLONE ACETONIDE 10 MG/ML IJ SUSP: 10.0000 mg | Freq: Once | INTRAMUSCULAR | Status: DC

## 2015-09-21 MED ORDER — DICLOFENAC SODIUM 75 MG PO TBEC: 75.0000 mg | DELAYED_RELEASE_TABLET | Freq: Two times a day (BID) | ORAL | Status: DC

## 2015-09-21 NOTE — Progress Notes (Deleted)
   Subjective:    Patient ID: Brian Lynch, male    DOB: 11-22-1960, 55 y.o.   MRN: [id number]  HPI    Review of Systems  Musculoskeletal: Positive for gait problem.       Objective:   Physical Exam        Assessment & Plan:

## 2015-09-21 NOTE — Progress Notes (Signed)
Patient ID: Brian Lynch, male   DOB: 03/28/1961, 55 y.o.   MRN: [id number] Subjective: Brian Lynch is a 55 y.o. male patient who presents to office for evaluation of Right>Left foot pain. Patient complains of progressive pain and numbness to Right>left 2-4 toes; reports that it feels like he is stepping on rock and its achy and sore; tried stretching, topicals, insoles, and change in shoe with no complete resolution. States that icing helps. Patient denies any other pedal complaints. Admits to pain starting after basketball in Dec. 2016.  Patient Active Problem List   Diagnosis Date Noted  . Loss of transverse plantar arch of right foot 04/15/2015  . Sesamoiditis 04/15/2015  . Nonallopathic lesion of thoracic region 02/06/2014  . Nonallopathic lesion of cervical region 02/06/2014  . Neck pain 01/01/2014  . Nonallopathic lesion-rib cage 01/01/2014  . Health maintenance examination 06/07/2013    Current Outpatient Prescriptions on File Prior to Visit  Medication Sig Dispense Refill  . Omega-3 Fatty Acids (FISH OIL) 1000 MG CAPS Take 1 capsule by mouth daily.      . Probiotic Product (PROBIOTIC DAILY PO) Take by mouth.     No current facility-administered medications on file prior to visit.    No Known Allergies  Objective:  General: Alert and oriented x3 in no acute distress  Dermatology: No open lesions bilateral lower extremities, no webspace macerations, no ecchymosis bilateral, all nails x 10 are well manicured.  Vascular: Dorsalis Pedis and Posterior Tibial pedal pulses palpable, Capillary Fill Time 3 seconds,(+) pedal hair growth bilateral, no edema bilateral lower extremities, Temperature gradient within normal limits.  Neurology: Gross sensation intact via light touch bilateral, Protective sensation intact  with Semmes Weinstein Monofilament to all pedal sites, Position sense intact, vibratory intact bilateral, Deep tendon reflexes within normal limits bilateral, No  babinski sign present bilateral. (- )Tinels/Moulders sign bilateral. Subjective numbness to right>left 2-4 toes.   Musculoskeletal: Mild tenderness with palpation at 2nd intermetatarsal space right>left foot,No pain with calf compression bilateral. Pes cavus foot type bilateral. Hammertoe bilateral. Strength within normal limits in all groups bilateral.    Xrays  Right and left Foot   Impression: Normal osseous mineralization. Pes Cavus foot type, + narrowing of 2nd and 3rd intermetatarsal space, mild hammertoe, no fracture/dislocaiton, no other acute concerns.   Assessment and Plan: Problem List Items Addressed This Visit    None    Visit Diagnoses    Foot pain, unspecified laterality    -  Primary    Relevant Medications    diclofenac (VOLTAREN) 75 MG EC tablet    Other Relevant Orders    DG Foot Complete Right    DG Foot Complete Left    Neuritis        Relevant Medications    triamcinolone acetonide (KENALOG) 10 MG/ML injection 10 mg    Metatarsalgia of both feet        Relevant Medications    triamcinolone acetonide (KENALOG) 10 MG/ML injection 10 mg    Pes cavus, unspecified laterality            -Complete examination performed -Xrays reviewed -Discussed treatement options for likely Mortons Metatarsalgia vs plantar plate strain -After oral consent and aseptic prep, injected a mixture containing 1 ml of 2%  plain lidocaine, 1 ml 0.5% plain marcaine, 0.5 ml of kenalog 10 and 0.5 ml of dexamethasone phosphate into Right and left 2nd intermetatarsal space without complication. Post-injection care discussed with patient.  -Rx Diclofenac to take  orally as instructed -Gave metatarsal pads to use as instructed -Recommend good supportive shoes daily -Cont with topical volteran, icing, and gentle stretching -Refrain from high impact activities at this time -Patient to return to office in 4 weeks for follow up eval or sooner if condition worsens. May consider MRI if symptoms fail  to improve  Landis Martins, DPM

## 2015-10-19 ENCOUNTER — Ambulatory Visit (INDEPENDENT_AMBULATORY_CARE_PROVIDER_SITE_OTHER): Payer: 59 | Admitting: Sports Medicine

## 2015-10-19 ENCOUNTER — Encounter: Payer: Self-pay | Admitting: Sports Medicine

## 2015-10-19 DIAGNOSIS — M216X9 Other acquired deformities of unspecified foot: Secondary | ICD-10-CM

## 2015-10-19 DIAGNOSIS — S99929D Unspecified injury of unspecified foot, subsequent encounter: Secondary | ICD-10-CM

## 2015-10-19 DIAGNOSIS — M7742 Metatarsalgia, left foot: Secondary | ICD-10-CM | POA: Diagnosis not present

## 2015-10-19 DIAGNOSIS — S8990XD Unspecified injury of unspecified lower leg, subsequent encounter: Secondary | ICD-10-CM | POA: Diagnosis not present

## 2015-10-19 DIAGNOSIS — M792 Neuralgia and neuritis, unspecified: Secondary | ICD-10-CM | POA: Diagnosis not present

## 2015-10-19 DIAGNOSIS — M7741 Metatarsalgia, right foot: Secondary | ICD-10-CM

## 2015-10-19 DIAGNOSIS — M79673 Pain in unspecified foot: Secondary | ICD-10-CM | POA: Diagnosis not present

## 2015-10-19 NOTE — Progress Notes (Signed)
Patient ID: Brian Lynch, male   DOB: 17-May-1960, 55 y.o.   MRN: [id number]   Subjective: Brian Lynch is a 55 y.o. male patient who returns to office for evaluation of Right>Left foot pain. Patient states that he still has pain and numbness to Right>left 2-4 toes; reports that he did not get any significant relief after injection on right. States that the injection helped on left. Did not take PO diclofenac and could not tell a difference with met pads. Patient denies any other pedal complaints.   Patient Active Problem List   Diagnosis Date Noted  . Loss of transverse plantar arch of right foot 04/15/2015  . Sesamoiditis 04/15/2015  . Nonallopathic lesion of thoracic region 02/06/2014  . Nonallopathic lesion of cervical region 02/06/2014  . Neck pain 01/01/2014  . Nonallopathic lesion-rib cage 01/01/2014  . Health maintenance examination 06/07/2013    Current Outpatient Prescriptions on File Prior to Visit  Medication Sig Dispense Refill  . diclofenac (VOLTAREN) 75 MG EC tablet Take 1 tablet (75 mg total) by mouth 2 (two) times daily. 30 tablet 0  . Omega-3 Fatty Acids (FISH OIL) 1000 MG CAPS Take 1 capsule by mouth daily.      . Probiotic Product (PROBIOTIC DAILY PO) Take by mouth.     Current Facility-Administered Medications on File Prior to Visit  Medication Dose Route Frequency Provider Last Rate Last Dose  . triamcinolone acetonide (KENALOG) 10 MG/ML injection 10 mg  10 mg Other Once [employer], DPM        No Known Allergies  Objective:  General: Alert and oriented x3 in no acute distress  Dermatology: No open lesions bilateral lower extremities, no webspace macerations, no ecchymosis bilateral, all nails x 10 are well manicured.  Vascular: Dorsalis Pedis and Posterior Tibial pedal pulses palpable, Capillary Fill Time 3 seconds,(+) pedal hair growth bilateral, no edema bilateral lower extremities, Temperature gradient within normal limits.  Neurology: Gross  sensation intact via light touch bilateral, Protective sensation intact  with Semmes Weinstein Monofilament to all pedal sites, Position sense intact, vibratory intact bilateral, Deep tendon reflexes within normal limits bilateral, No babinski sign present bilateral. (-) Tinels/Moulders sign bilateral. Subjective numbness to right>left 2-4 toes.   Musculoskeletal: Mild tenderness with palpation at 2nd intermetatarsal space and sub met 2 right>left foot with mild dorsal elevation of right>left 2nd toe, negative lachman,No pain with calf compression bilateral. Pes cavus foot type bilateral. Hammertoe bilateral. Strength within normal limits in all groups bilateral.    Assessment and Plan: Problem List Items Addressed This Visit    None    Visit Diagnoses    Injury of plantar plate, unspecified laterality, subsequent encounter    -  Primary    Relevant Orders    MR Foot Left W Wo Contrast    MR Foot Right W Wo Contrast    Foot pain, unspecified laterality        Relevant Orders    MR Foot Left W Wo Contrast    MR Foot Right W Wo Contrast    Neuritis        Relevant Orders    MR Foot Left W Wo Contrast    MR Foot Right W Wo Contrast    Metatarsalgia of both feet        Relevant Orders    MR Foot Left W Wo Contrast    MR Foot Right W Wo Contrast    Pes cavus, unspecified laterality  Relevant Orders    MR Foot Left W Wo Contrast    MR Foot Right W Wo Contrast        -Complete examination performed -Discussed treatement options for likely Mortons Metatarsalgia vs plantar plate strain -Rx MRI for future evaluation r/o plantar plate tear -Applied and educated patient on tapping 2nd MTPJ bilateral to apply daily  -Patient declines to start taking Diclofenac even though being counseled on use -Recommend good supportive shoes daily -Cont with topical volteran, icing, and gentle stretching -Refrain from high impact activities at this time -Patient to return to office after MRI for  follow up eval or sooner if condition worsens.  Landis Martins, DPM

## 2015-10-20 ENCOUNTER — Telehealth: Payer: Self-pay | Admitting: *Deleted

## 2015-10-20 DIAGNOSIS — M79673 Pain in unspecified foot: Secondary | ICD-10-CM

## 2015-10-20 DIAGNOSIS — M7740 Metatarsalgia, unspecified foot: Secondary | ICD-10-CM

## 2015-10-20 DIAGNOSIS — S99929D Unspecified injury of unspecified foot, subsequent encounter: Secondary | ICD-10-CM

## 2015-10-20 NOTE — Telephone Encounter (Addendum)
-----   Message from Landis Martins, [place] sent at 10/19/2015  4:56 PM EDT ----- Regarding: MRI foot bilateral MRI Both feet R/o plantar plate injury sub 2nd metatarsal Thanks Dr. Cannon Kettle. Orders faxed to [facility] [phone]. 11/20/2015-Pt called for MRI results.

## 2015-11-04 ENCOUNTER — Telehealth: Payer: Self-pay | Admitting: *Deleted

## 2015-11-04 NOTE — Telephone Encounter (Signed)
"  I haven't heard anything about the date for my MRI.  I was supposed to be scheduled 2 weeks ago."    I called and spoke to [place]  She stated an order had not been called in for this patient.  She scheduled him for 11/10/2015 at 3pm.  I'm calling to apologize for the wait.  Dr. Cannon Kettle had put the order in but the orders weren't faxed or called in the facility.  I called and they scheduled you for 10/31/2015 at 3 pm at [facility].  Is that date and location okay for you?  "Sure, that will be great.  Thanks for calling me back."

## 2015-11-10 ENCOUNTER — Ambulatory Visit (HOSPITAL_COMMUNITY)
Admission: RE | Admit: 2015-11-10 | Discharge: 2015-11-10 | Disposition: A | Discharge status: Home / Self Care | Payer: 59 | Source: Ambulatory Visit | Attending: Sports Medicine | Admitting: Sports Medicine

## 2015-11-10 DIAGNOSIS — M719 Bursopathy, unspecified: Secondary | ICD-10-CM | POA: Diagnosis not present

## 2015-11-10 DIAGNOSIS — M216X9 Other acquired deformities of unspecified foot: Secondary | ICD-10-CM | POA: Insufficient documentation

## 2015-11-10 DIAGNOSIS — M7742 Metatarsalgia, left foot: Secondary | ICD-10-CM

## 2015-11-10 DIAGNOSIS — M792 Neuralgia and neuritis, unspecified: Secondary | ICD-10-CM

## 2015-11-10 DIAGNOSIS — S99929D Unspecified injury of unspecified foot, subsequent encounter: Secondary | ICD-10-CM

## 2015-11-10 DIAGNOSIS — M7741 Metatarsalgia, right foot: Secondary | ICD-10-CM

## 2015-11-10 DIAGNOSIS — X58XXXD Exposure to other specified factors, subsequent encounter: Secondary | ICD-10-CM | POA: Diagnosis not present

## 2015-11-10 DIAGNOSIS — M79673 Pain in unspecified foot: Secondary | ICD-10-CM | POA: Insufficient documentation

## 2015-11-10 DIAGNOSIS — S8990XD Unspecified injury of unspecified lower leg, subsequent encounter: Secondary | ICD-10-CM | POA: Insufficient documentation

## 2015-11-10 DIAGNOSIS — R936 Abnormal findings on diagnostic imaging of limbs: Secondary | ICD-10-CM | POA: Diagnosis not present

## 2015-11-10 DIAGNOSIS — M19072 Primary osteoarthritis, left ankle and foot: Secondary | ICD-10-CM | POA: Diagnosis not present

## 2015-11-10 DIAGNOSIS — M79671 Pain in right foot: Secondary | ICD-10-CM | POA: Diagnosis not present

## 2015-11-10 MED ORDER — GADOBENATE DIMEGLUMINE 529 MG/ML IV SOLN
20.0000 mL | Freq: Once | INTRAVENOUS | Status: AC | PRN
  Administered 2015-11-10: 18 mL via INTRAVENOUS

## 2015-11-20 NOTE — Telephone Encounter (Signed)
Patient should make appointment to discuss results and plan of care

## 2015-11-23 ENCOUNTER — Telehealth: Payer: Self-pay | Admitting: *Deleted

## 2015-11-23 NOTE — Telephone Encounter (Signed)
Pt called for MRI results.  Informed pt Dr. Cannon Kettle required pt to make appt to discuss MRI results.

## 2015-11-30 ENCOUNTER — Encounter: Payer: Self-pay | Admitting: Sports Medicine

## 2015-11-30 ENCOUNTER — Ambulatory Visit (INDEPENDENT_AMBULATORY_CARE_PROVIDER_SITE_OTHER): Payer: 59 | Admitting: Sports Medicine

## 2015-11-30 DIAGNOSIS — M19079 Primary osteoarthritis, unspecified ankle and foot: Secondary | ICD-10-CM

## 2015-11-30 DIAGNOSIS — M2580 Other specified joint disorders, unspecified joint: Secondary | ICD-10-CM

## 2015-11-30 DIAGNOSIS — M216X9 Other acquired deformities of unspecified foot: Secondary | ICD-10-CM | POA: Diagnosis not present

## 2015-11-30 DIAGNOSIS — M79673 Pain in unspecified foot: Secondary | ICD-10-CM | POA: Diagnosis not present

## 2015-11-30 DIAGNOSIS — M779 Enthesopathy, unspecified: Secondary | ICD-10-CM | POA: Diagnosis not present

## 2015-11-30 DIAGNOSIS — M7750 Other enthesopathy of unspecified foot: Secondary | ICD-10-CM

## 2015-11-30 NOTE — Progress Notes (Signed)
Patient ID: DOUGLES KIMMEY, male   DOB: 02/20/1961, 55 y.o.   MRN: [id number]   Subjective: Brian Lynch is a 55 y.o. male patient who returns to office for evaluation of Right>Left foot pain and for discussion of MRI results. Patient states that he still has pain and numbness to Right>left 2-4 toes; reports minimal difference with taping, did not take Diclofenac out of fear of side effects as per wife recs who is a internist. Patient has been using topical voltaren, icing, stretching with no complete relief. Patient denies any other pedal complaints.   Patient Active Problem List   Diagnosis Date Noted  . Loss of transverse plantar arch of right foot 04/15/2015  . Sesamoiditis 04/15/2015  . Nonallopathic lesion of thoracic region 02/06/2014  . Nonallopathic lesion of cervical region 02/06/2014  . Neck pain 01/01/2014  . Nonallopathic lesion-rib cage 01/01/2014  . Health maintenance examination 06/07/2013    Current Outpatient Prescriptions on File Prior to Visit  Medication Sig Dispense Refill  . diclofenac (VOLTAREN) 75 MG EC tablet Take 1 tablet (75 mg total) by mouth 2 (two) times daily. 30 tablet 0  . Omega-3 Fatty Acids (FISH OIL) 1000 MG CAPS Take 1 capsule by mouth daily.      . Probiotic Product (PROBIOTIC DAILY PO) Take by mouth.     Current Facility-Administered Medications on File Prior to Visit  Medication Dose Route Frequency Provider Last Rate Last Dose  . triamcinolone acetonide (KENALOG) 10 MG/ML injection 10 mg  10 mg Other Once [employer], DPM        No Known Allergies  Objective:  General: Alert and oriented x3 in no acute distress  No acute changes with exam   Dermatology: No open lesions bilateral lower extremities, no webspace macerations, no ecchymosis bilateral, all nails x 10 are well manicured.  Vascular: Dorsalis Pedis and Posterior Tibial pedal pulses palpable, Capillary Fill Time 3 seconds,(+) pedal hair growth bilateral, no edema bilateral  lower extremities, Temperature gradient within normal limits.  Neurology: Gross sensation intact via light touch bilateral, Protective sensation intact  with Semmes Weinstein Monofilament to all pedal sites, Position sense intact, vibratory intact bilateral, Deep tendon reflexes within normal limits bilateral, No babinski sign present bilateral. (-) Tinels/Moulders sign bilateral. Subjective numbness to right>left 2-4 toes.   Musculoskeletal: Mild tenderness with palpation at 1-2ndintermetatarsal space and sub met 2 right>left foot with mild dorsal elevation of right>left 2nd toe, negative lachman,No pain with calf compression bilateral. Pes cavus foot type bilateral. Hammertoe bilateral. Strength within normal limits in all groups bilateral.   MRI bursitis 5 MTPJ, and arthritis tibial sesamoid on right, bursitis on left. No plantar plate involvement.   Assessment and Plan: Problem List Items Addressed This Visit      Musculoskeletal and Integument   Sesamoiditis    Other Visit Diagnoses    Foot pain, unspecified laterality    -  Primary   Pes cavus, unspecified laterality       Osteoarthritis of ankle and foot, unspecified laterality       Enthesopathy of ankle and tarsus           -Complete examination performed -MRI reviewed  -Discussed treatement options for likely Mortons Metatarsalgia/bursitis secondary to foot type with arthritis and foot deformity  -Scanned patient for custom functional foot orthotics with offloading and deep heel cup -Patient declines to start taking Diclofenac even though being counseled on use -Recommend good supportive shoes daily -Cont with topical volteran, icing, and  gentle stretching -Refrain from high impact activities at this time -Patient to return to office pick up orthotics or sooner if condition worsens. Advised patient that if numbness continues will consider EMG/NCV testing for numbness on right.   Landis Martins, DPM

## 2015-12-01 ENCOUNTER — Encounter: Payer: Self-pay | Admitting: Family Medicine

## 2015-12-28 ENCOUNTER — Encounter: Payer: Self-pay | Admitting: Sports Medicine

## 2015-12-28 ENCOUNTER — Ambulatory Visit (INDEPENDENT_AMBULATORY_CARE_PROVIDER_SITE_OTHER): Payer: 59 | Admitting: Podiatry

## 2015-12-28 DIAGNOSIS — M7742 Metatarsalgia, left foot: Secondary | ICD-10-CM

## 2015-12-28 DIAGNOSIS — M792 Neuralgia and neuritis, unspecified: Secondary | ICD-10-CM | POA: Diagnosis not present

## 2015-12-28 DIAGNOSIS — M7741 Metatarsalgia, right foot: Secondary | ICD-10-CM | POA: Diagnosis not present

## 2015-12-28 DIAGNOSIS — M79673 Pain in unspecified foot: Secondary | ICD-10-CM

## 2015-12-28 NOTE — Patient Instructions (Signed)

## 2016-01-05 NOTE — Progress Notes (Signed)
Subjective: 55 year old male presents the office if the cup orthotics for follow-up evaluation of right > left pain. He states he still the same pain he is not having much numbness or tingling to his toes. Overall he feels that he is unchanged. No recent injury or trauma. No swelling or redness. Denies any systemic complaints such as fevers, chills, nausea, vomiting. No acute changes since last appointment, and no other complaints at this time.   Objective: AAO x3, NAD DP/PT pulses palpable bilaterally, CRT less than 3 seconds This continuation of mild tenderness in the first second interspace and submetatarsal 2 on the right foot worsen left. There is no specific area pinpoint bony tenderness or pain the vibratory sensation. There is no overlying edema, erythema, increase in warmth bilaterally. PT range of motion intact. Slight deviation of the digits particularly the right second toe. Unable to palpate a neuroma this time there is no clicking. No open lesions or pre-ulcerative lesions.  No pain with calf compression, swelling, warmth, erythema  Assessment: Capsulitis, tendinitis possible neuroma  Plan: -All treatment options discussed with the patient including all alternatives, risks, complications.  -Orthotics were dispensed. Also gave him metatarsal offloading pads to potentially at the inserts to see if this will help. Oral and written break in instructions were discussed. Continue ice to the area discussed supportive shoe gear. Continue topical anti-inflammatory. Follow-up with Dr. Cannon Kettle in 4 weeks or sooner if needed. -Patient encouraged to call the office with any questions, concerns, change in symptoms.   Celesta Gentile, DPM

## 2016-01-26 ENCOUNTER — Ambulatory Visit: Payer: 59 | Admitting: Sports Medicine

## 2016-02-01 ENCOUNTER — Ambulatory Visit (INDEPENDENT_AMBULATORY_CARE_PROVIDER_SITE_OTHER): Payer: 59

## 2016-02-01 DIAGNOSIS — Z23 Encounter for immunization: Secondary | ICD-10-CM

## 2016-02-23 ENCOUNTER — Ambulatory Visit: Payer: 59 | Admitting: Sports Medicine

## 2016-11-04 ENCOUNTER — Telehealth: Payer: Self-pay

## 2016-11-07 ENCOUNTER — Ambulatory Visit (INDEPENDENT_AMBULATORY_CARE_PROVIDER_SITE_OTHER): Payer: 59 | Admitting: Family Medicine

## 2016-11-07 ENCOUNTER — Encounter: Payer: Self-pay | Admitting: Family Medicine

## 2016-11-07 VITALS — BP 110/80 | HR 66 | Temp 97.9°F | Ht 72.0 in | Wt 185.2 lb

## 2016-11-07 DIAGNOSIS — Z0000 Encounter for general adult medical examination without abnormal findings: Secondary | ICD-10-CM

## 2016-11-07 DIAGNOSIS — Z23 Encounter for immunization: Secondary | ICD-10-CM | POA: Diagnosis not present

## 2016-11-07 LAB — CBC
HEMATOCRIT: 42.7 % (ref 39.0–52.0)
HEMOGLOBIN: 14.4 g/dL (ref 13.0–17.0)
MCHC: 33.8 g/dL (ref 30.0–36.0)
MCV: 90.6 fl (ref 78.0–100.0)
Platelets: 294 10*3/uL (ref 150.0–400.0)
RBC: 4.71 Mil/uL (ref 4.22–5.81)
RDW: 13.4 % (ref 11.5–15.5)
WBC: 7.2 10*3/uL (ref 4.0–10.5)

## 2016-11-07 LAB — COMPREHENSIVE METABOLIC PANEL
ALBUMIN: 4 g/dL (ref 3.5–5.2)
ALK PHOS: 55 U/L (ref 39–117)
ALT: 15 U/L (ref 0–53)
AST: 15 U/L (ref 0–37)
BILIRUBIN TOTAL: 1.2 mg/dL (ref 0.2–1.2)
BUN: 22 mg/dL (ref 6–23)
CALCIUM: 9.2 mg/dL (ref 8.4–10.5)
CO2: 29 mEq/L (ref 19–32)
CREATININE: 0.88 mg/dL (ref 0.40–1.50)
Chloride: 105 mEq/L (ref 96–112)
GFR: 94.85 mL/min (ref >60.00)
Glucose, Bld: 96 mg/dL (ref 70–99)
Potassium: 4.1 mEq/L (ref 3.5–5.1)
Sodium: 139 mEq/L (ref 135–145)
TOTAL PROTEIN: 6.7 g/dL (ref 6.0–8.3)

## 2016-11-07 LAB — LIPID PANEL
CHOLESTEROL: 177 mg/dL (ref 0–200)
HDL: 53.6 mg/dL (ref >39.00)
LDL Cholesterol: 106 mg/dL — ABNORMAL HIGH (ref 0–99)
NONHDL: 123.62
TRIGLYCERIDES: 90 mg/dL (ref 0.0–149.0)
Total CHOL/HDL Ratio: 3
VLDL: 18 mg/dL (ref 0.0–40.0)

## 2016-11-07 NOTE — Progress Notes (Signed)
Chief Complaint  Patient presents with  . 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   His last physical was >1 year ago.  Current diet: in general, a "healthy" diet   Current exercise: basketball, walking Weight trend: stable  Does pt snore? No. 2013 Daytime fatigue? No. Seat belt? Yes.    Health maintenance Shingrix- No Colonoscopy- Yes Tetanus- No HIV- Yes Hep C- Yes   Past Medical History:  Diagnosis Date  . Foot pain    Podiatrist: sesamoiditis and pes planus, enthesopathy of ankle/foot, osteoarthritis ankle/foot    Past Surgical History:  Procedure Laterality Date  . COLONOSCOPY  08/03/10   Normal (small internal hemorrhoids).  Repeat in 10 yrs.   Medications  Current Outpatient Prescriptions on File Prior to Visit  Medication Sig Dispense Refill  . Omega-3 Fatty Acids (FISH OIL) 1000 MG CAPS Take 1 capsule by mouth daily.      . Probiotic Product (PROBIOTIC DAILY PO) Take by mouth.     No current facility-administered medications on file prior to visit.     Allergies No Known Allergies Family History Family History  Problem Relation Age of Onset  . Atrial fibrillation Mother   . Heart failure Mother   . COPD Mother   . Glaucoma Mother   . Stroke Father   . Aneurysm Father        AAA    Review of Systems: Constitutional:  no unexpected change in weight, no fevers or chills Eye:  no recent significant change in vision Ear/Nose/Mouth/Throat:  Ears:  no tinnitus or hearing loss Nose/Mouth/Throat:  no complaints of nasal congestion or bleeding, no sore throat and oral sores Cardiovascular:  no chest pain, no palpitations Respiratory:  no cough and no shortness of breath Gastrointestinal:  no abdominal pain, no change in bowel habits, no nausea, vomiting, diarrhea, or constipation and no black or bloody stool GU:  Male: negative for dysuria, frequency, and incontinence and  negative for prostate symptoms Musculoskeletal/Extremities:  no pain, redness, or swelling of the joints Integumentary (Skin/Breast):  no abnormal skin lesions reported Neurologic:  no headaches, no numbness, tingling Endocrine: No weight changes, masses in the neck, heat/cold intolerance, bowel or skin changes, or cardiovascular system symptoms Hematologic/Lymphatic:  no abnormal bleeding, no HIV risk factors, no night sweats, no swollen nodes, no weight loss  Exam BP 110/80 (BP Location: Left Arm, Patient Position: Sitting, Cuff Size: Normal)   Pulse 66   Temp 97.9 F (36.6 C) (Oral)   Ht 6' (1.829 m)   Wt 185 lb 3.2 oz (84 kg)   SpO2 98%   BMI 25.12 kg/m  General:  well developed, well nourished, in no apparent distress Skin:  On the medial RLE, there is a pinpoint area surrounded by slight hyperpigmentation that is w/o TTP, fluctuance, or drainage; just distal to it is a salmon colored patch that is without scaling, TTP, fluctuance, warmth or streaking; otherwise no significant moles, warts, or growths Head:  no masses, lesions, or tenderness Eyes:  pupils equal and round, sclera anicteric without injection Ears:  canals without lesions, TMs shiny without retraction, no obvious effusion, no erythema Nose:  nares patent, septum midline, mucosa normal Throat/Pharynx:  lips and gingiva without lesion; tongue and uvula midline; non-inflamed pharynx; no exudates or postnasal drainage Neck: neck supple without adenopathy, thyromegaly, or masses Lungs:  clear to auscultation,  breath sounds equal bilaterally, no respiratory distress Cardio:  regular rate and rhythm without murmurs, heart sounds without clicks or rubs Abdomen:  abdomen soft, nontender; bowel sounds normal; no masses or organomegaly Genital (male): circumcised penis, no lesions or discharge; testes present bilaterally without masses or tenderness Rectal: Deferred Musculoskeletal:  symmetrical muscle groups noted without  atrophy or deformity, 5/5 strength throughout; +TTP over the plantar surface of the 2nd metatarsal head, no edema or warmth Extremities:  no clubbing, cyanosis, or edema, no deformities, no skin discoloration Neuro:  gait normal; deep tendon reflexes normal and symmetric Psych: well oriented with normal range of affect and appropriate judgment/insight  Assessment and Plan  Well adult exam - Plan: Lipid panel, CBC, Comprehensive metabolic panel   Well 56 y.o. male. Counseled on diet and exercise. Challenged him to lift weights. Bee sting appears to be healing well. There is a salmon colored patch of skin distal to it. No signs of infection and it does not itch. Watchful waiting for now. He will reach out to [place] if anything changes.  Regarding his foot pain, he has seen sports medicine and podiatry. I brought up metatarsal pads, orthotics/inserts to which he has already tried. He was offered a second opinion, but declined at this time. He will let us know if anything changes.  Discussed risks and benefits of prostate cancer screening, including the USPSTF rec that there may be some small benefit to screening males 74-69 yo, but he declined at this time. Will bring up again in a year.  Immunizations, labs, and further orders as above. Follow up in 1 year or prn. The patient voiced understanding and agreement to the plan.  [place], DO 11/07/16 10:02 AM

## 2016-11-07 NOTE — Patient Instructions (Signed)
If anything changes with the patch of skin on your leg, have your wife text me or let us know. I expect the bee sting area will improve over the next 2-3 days if you were stung 1 week ago. It does not look or feel infected.  If you ever desire to have a second opinion for your foot, let us know. You do not need an appointment.  Give [place] 2-3 business days to get the results of your labs back.   Let us know if you need anything.

## 2016-11-07 NOTE — Addendum Note (Signed)
Addended by: Harl Bowie on: 11/07/2016 11:51 AM   Modules accepted: Orders

## 2017-01-18 ENCOUNTER — Ambulatory Visit: Payer: Self-pay

## 2017-01-18 ENCOUNTER — Other Ambulatory Visit: Payer: Self-pay

## 2017-01-18 ENCOUNTER — Ambulatory Visit (INDEPENDENT_AMBULATORY_CARE_PROVIDER_SITE_OTHER)
Admission: RE | Admit: 2017-01-18 | Discharge: 2017-01-18 | Disposition: A | Discharge status: Home / Self Care | Payer: 59 | Source: Ambulatory Visit | Attending: Family Medicine | Admitting: Family Medicine

## 2017-01-18 ENCOUNTER — Other Ambulatory Visit: Payer: Self-pay | Admitting: Family Medicine

## 2017-01-18 ENCOUNTER — Ambulatory Visit (INDEPENDENT_AMBULATORY_CARE_PROVIDER_SITE_OTHER): Payer: 59 | Admitting: Family Medicine

## 2017-01-18 ENCOUNTER — Encounter: Payer: Self-pay | Admitting: Family Medicine

## 2017-01-18 DIAGNOSIS — M79644 Pain in right finger(s): Secondary | ICD-10-CM

## 2017-01-18 DIAGNOSIS — S6991XA Unspecified injury of right wrist, hand and finger(s), initial encounter: Secondary | ICD-10-CM

## 2017-01-18 DIAGNOSIS — S62639A Displaced fracture of distal phalanx of unspecified finger, initial encounter for closed fracture: Secondary | ICD-10-CM

## 2017-01-18 DIAGNOSIS — S62609A Fracture of unspecified phalanx of unspecified finger, initial encounter for closed fracture: Secondary | ICD-10-CM | POA: Insufficient documentation

## 2017-01-18 DIAGNOSIS — S63259D Unspecified dislocation of unspecified finger, subsequent encounter: Secondary | ICD-10-CM

## 2017-01-18 MED ORDER — TRAMADOL HCL 50 MG PO TABS: 50.0000 mg | ORAL_TABLET | Freq: Three times a day (TID) | ORAL | 0 refills | Status: DC | PRN

## 2017-01-18 NOTE — Progress Notes (Signed)
Spoke with patient's wife, will order XR for finger injury at [facility].

## 2017-01-18 NOTE — Assessment & Plan Note (Signed)
Patient does have what appears to be a fracture dislocation of the phalanx of the fifth finger. This was of the proximal IP joint. Patient did have this manipulated and placed today. Patient put in 69 of flexion secondary to the potential for the ventral plate fracture. Patient will have repeat x-rays in one week and follow-up at that time. Tramadol given for any breakthrough pain. Discussed icing regimen.

## 2017-01-18 NOTE — Patient Instructions (Addendum)
Good to see you  Brian Lynch is your friend.  Try to leave the splint on until I see you again next week Xray again next week before you see me OK to double book next week

## 2017-01-18 NOTE — Progress Notes (Signed)
[facility] [address] [place], [place] [address] Phone: [phone] Subjective:     CC: Right hand injury  [id number]  Brian Lynch is a 56 y.o. male coming in with complaint of right hand pain. Patient was playing basket all. Ball hit his fifth finger. Had significant pain initially. Was sent for an x-ray. X-ray inability visualized by me showing patient having a fracture dislocation of the fifth IP. Possible ventral plate fracture also noted. Patient denies any numbness. States that it is quite painful. Wrist the severity pain is 7 out of 10. Happen within an hour     Past Medical History:  Diagnosis Date  . Foot pain    Podiatrist: sesamoiditis and pes planus, enthesopathy of ankle/foot, osteoarthritis ankle/foot   Past Surgical History:  Procedure Laterality Date  . COLONOSCOPY  08/03/10   Normal (small internal hemorrhoids).  Repeat in 10 yrs.   Social History   Social History  . Marital status: Married    Spouse name: Brian Lynch  . Number of children: 3  . Years of education: N/A   Occupational History  . Writer    Social History Main Topics  . Smoking status: Never Smoker  . Smokeless tobacco: Never Used  . Alcohol use 2.5 oz/week    5 Standard drinks or equivalent per week  . Drug use: No  . Sexual activity: Not on file   Other Topics Concern  . Not on file   Social History Narrative   Married, 3 children.   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   No Tob, alc, or drugs.   No Known Allergies Family History  Problem Relation Age of Onset  . Atrial fibrillation Mother   . Heart failure Mother   . COPD Mother   . Glaucoma Mother   . Stroke Father   . Aneurysm Father        AAA     Past medical history, social, surgical and family history all reviewed in electronic medical record.  No pertanent information unless stated regarding to the chief complaint.   Review of Systems:Review of systems updated and  as accurate as of 01/18/17  No headache, visual changes, nausea, vomiting, diarrhea, constipation, dizziness, abdominal pain, skin rash, fevers, chills, night sweats, weight loss, swollen lymph nodes, body aches, joint swelling, muscle aches, chest pain, shortness of breath, mood changes.   Objective  There were no vitals taken for this visit. Systems examined below as of 01/18/17   General: No apparent distress alert and oriented x3 mood and affect normal, dressed appropriately.  HEENT: Pupils equal, extraocular movements intact  Respiratory: Patient's speak in full sentences and does not appear short of breath  Cardiovascular: No lower extremity edema, non tender, no erythema  Skin: Warm dry intact with no signs of infection or rash on extremities or on axial skeleton.  Abdomen: Soft nontender  Neuro: Cranial nerves II through XII are intact, neurovascularly intact in all extremities with 2+ DTRs and 2+ pulses.  Lymph: No lymphadenopathy of posterior or anterior cervical chain or axillae bilaterally.  Gait normal with good balance and coordination.  MSK:  Non tender with full range of motion and good stability and symmetric strength and tone of shoulders, elbows, wrist, hip, knee and ankles bilaterally.  Right hand shows the patient does have pain dorsally dislocated IP joint of the fifth finger. Neurovascular intact. Unable to flex the finger significant limp. Good capillary refill. No pain over the  metacarpal joint. No significant angulation.  After verbal consent patient did have dorsal pressure over the proximal aspect of the joint and did have relocation done of the finger.   Impression and Recommendations:     This case required medical decision making of moderate complexity.      Note: This dictation was prepared with Dragon dictation along with smaller phrase technology. Any transcriptional errors that result from this process are unintentional.

## 2017-01-24 ENCOUNTER — Ambulatory Visit (INDEPENDENT_AMBULATORY_CARE_PROVIDER_SITE_OTHER): Payer: 59 | Admitting: Family Medicine

## 2017-01-24 ENCOUNTER — Encounter: Payer: Self-pay | Admitting: Family Medicine

## 2017-01-24 ENCOUNTER — Ambulatory Visit (INDEPENDENT_AMBULATORY_CARE_PROVIDER_SITE_OTHER)
Admission: RE | Admit: 2017-01-24 | Discharge: 2017-01-24 | Disposition: A | Discharge status: Home / Self Care | Payer: 59 | Source: Ambulatory Visit | Attending: Family Medicine | Admitting: Family Medicine

## 2017-01-24 DIAGNOSIS — S62639A Displaced fracture of distal phalanx of unspecified finger, initial encounter for closed fracture: Secondary | ICD-10-CM

## 2017-01-24 DIAGNOSIS — S63286A Dislocation of proximal interphalangeal joint of right little finger, initial encounter: Secondary | ICD-10-CM | POA: Diagnosis not present

## 2017-01-24 DIAGNOSIS — S62609A Fracture of unspecified phalanx of unspecified finger, initial encounter for closed fracture: Secondary | ICD-10-CM

## 2017-01-24 DIAGNOSIS — S63259D Unspecified dislocation of unspecified finger, subsequent encounter: Secondary | ICD-10-CM | POA: Diagnosis not present

## 2017-01-24 NOTE — Assessment & Plan Note (Signed)
Patient does have a volar plate fracture that is intra-articular but less than 30% of the joint noted. Less than 15 of angulation so I think patient has a possibility of healing. Patient was put in 45 flexion and it think will be beneficial. We discussed with patient about vitamin D to help with healing process. Patient will come back in 2-3 weeks for further evaluation with likely one more x-ray to make sure patient is responding well.

## 2017-01-24 NOTE — Patient Instructions (Addendum)
Good to see you  Brian Lynch is your friend.  Wear brace another 2 weeks with a little flexion  Starting next week can play but only in the brace and buddy taped to the other finger.  See me again in 3 weeks to get you moving.

## 2017-01-24 NOTE — Progress Notes (Signed)
[facility] [address] [place], [place] [address] Phone: [phone] Subjective:    I'm seeing this patient by the request  of:    CC: Right finger follow-up  [id number]  Brian Lynch is a 56 y.o. male coming in with complaint of right fifth finger. Patient was found to have a fracture dislocation and needed relocation finger. This was at the PIP joint. Patient has been wearing the splint since last week. Feeling like he is making progress. Patient states it is not as tender. Patient states that it is still little sore though. Patient is wondering when he can but basketball again. Feels like he still has a grip strength.   patient did have x-rays taken today. These were independently visualized by me. Volar plate fracture noted with mild displacement. Does seem to be intra-articular but less than 10%. Very mild angulation noted.  Past Medical History:  Diagnosis Date  . Foot pain    Podiatrist: sesamoiditis and pes planus, enthesopathy of ankle/foot, osteoarthritis ankle/foot   Past Surgical History:  Procedure Laterality Date  . COLONOSCOPY  08/03/10   Normal (small internal hemorrhoids).  Repeat in 10 yrs.   Social History   Social History  . Marital status: Married    Spouse name: Brian Lynch  . Number of children: 3  . Years of education: N/A   Occupational History  . Writer    Social History Main Topics  . Smoking status: Never Smoker  . Smokeless tobacco: Never Used  . Alcohol use 2.5 oz/week    5 Standard drinks or equivalent per week  . Drug use: No  . Sexual activity: Not on file   Other Topics Concern  . Not on file   Social History Narrative   Married, 3 children.   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   No Tob, alc, or drugs.   No Known Allergies Family History  Problem Relation Age of Onset  . Atrial fibrillation Mother   . Heart failure Mother   . COPD Mother   . Glaucoma Mother   . Stroke Father   .  Aneurysm Father        AAA     Past medical history, social, surgical and family history all reviewed in electronic medical record.  No pertanent information unless stated regarding to the chief complaint.   Review of Systems:Review of systems updated and as accurate as of 01/24/17  No headache, visual changes, nausea, vomiting, diarrhea, constipation, dizziness, abdominal pain, skin rash, fevers, chills, night sweats, weight loss, swollen lymph nodes, body aches, joint swelling, muscle aches, chest pain, shortness of breath, mood changes.   Objective  There were no vitals taken for this visit. Systems examined below as of 01/24/17   General: No apparent distress alert and oriented x3 mood and affect normal, dressed appropriately.  HEENT: Pupils equal, extraocular movements intact  Respiratory: Patient's speak in full sentences and does not appear short of breath  Cardiovascular: No lower extremity edema, non tender, no erythema  Skin: Warm dry intact with no signs of infection or rash on extremities or on axial skeleton.  Abdomen: Soft nontender  Neuro: Cranial nerves II through XII are intact, neurovascularly intact in all extremities with 2+ DTRs and 2+ pulses.  Lymph: No lymphadenopathy of posterior or anterior cervical chain or axillae bilaterally.  Gait normal with good balance and coordination.  MSK:  Non tender with full range of motion and good stability and symmetric  strength and tone of shoulders, elbows, wrist, hip, knee and ankles bilaterally. Hand exam shows the patient's fifth finger does have some bruising now at the DIP. Patient though is more tender on the Radial aspect very mild angulation noted. More pain over the volar plate. Patient does have flexion of the DIP when isolated.   Impression and Recommendations:     This case required medical decision making of moderate complexity.      Note: This dictation was prepared with Dragon dictation along with smaller  phrase technology. Any transcriptional errors that result from this process are unintentional.

## 2017-02-18 NOTE — Progress Notes (Signed)
[facility] Brian Lynch, Brian Lynch [id number] Phone: [phone] Subjective:    I'm seeing this patient by the request  of:    CC: Finger dislocation follow-up  HYI:FOYDXAJOIN  Brian Lynch is a 56 y.o. male coming in with complaint of finger dislocation. Patient did have a small fracture that was avulsion as well. Patient did not have any significant angulation was to do bracing. Was to avoid certain activities. Patient states Overall doing better. Patient has been buddy taping it and playing basketball. Still has soreness when he hits it. Denies any new symptoms. Still unable to flex all the way.       Past Medical History:  Diagnosis Date  . Foot pain    Podiatrist: sesamoiditis and pes planus, enthesopathy of ankle/foot, osteoarthritis ankle/foot   Past Surgical History:  Procedure Laterality Date  . COLONOSCOPY  08/03/10   Normal (small internal hemorrhoids).  Repeat in 10 yrs.   Social History   Social History  . Marital status: Married    Spouse name: Brian Lynch  . Number of children: 3  . Years of education: N/A   Occupational History  . Writer    Social History Main Topics  . Smoking status: Never Smoker  . Smokeless tobacco: Never Used  . Alcohol use 2.5 oz/week    5 Standard drinks or equivalent per week  . Drug use: No  . Sexual activity: Not on file   Other Topics Concern  . Not on file   Social History Narrative   Married, 3 children.   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   No Tob, alc, or drugs.   No Known Allergies Family History  Problem Relation Age of Onset  . Atrial fibrillation Mother   . Heart failure Mother   . COPD Mother   . Glaucoma Mother   . Stroke Father   . Aneurysm Father        AAA     Past medical history, social, surgical and family history all reviewed in electronic medical record.  No pertanent information unless stated regarding to the chief complaint.   Review of  Systems:Review of systems updated and as accurate as of 02/18/17  No headache, visual changes, nausea, vomiting, diarrhea, constipation, dizziness, abdominal pain, skin rash, fevers, chills, night sweats, weight loss, swollen lymph nodes, body aches, joint swelling,chest pain, shortness of breath, mood changes. Positive muscle aches  Objective  There were no vitals taken for this visit. Systems examined below as of 02/18/17   General: No apparent distress alert and oriented x3 mood and affect normal, dressed appropriately.  HEENT: Pupils equal, extraocular movements intact  Respiratory: Patient's speak in full sentences and does not appear short of breath  Cardiovascular: No lower extremity edema, non tender, no erythema  Skin: Warm dry intact with no signs of infection or rash on extremities or on axial skeleton.  Abdomen: Soft nontender  Neuro: Cranial nerves II through XII are intact, neurovascularly intact in all extremities with 2+ DTRs and 2+ pulses.  Lymph: No lymphadenopathy of posterior or anterior cervical chain or axillae bilaterally.  Gait normal with good balance and coordination.  MSK:  Non tender with full range of motion and good stability and symmetric strength and tone of shoulders, elbows, wrist, hip, knee and ankles bilaterally.  Hand exam on the right hand shows the patient's fifth finger does have some erythema mostly around the PIP. Very mild angulation. Patient is still  lacking the last 25 of flexion. Neurovascularly intact distally.     Impression and Recommendations:     This case required medical decision making of moderate complexity.      Note: This dictation was prepared with Dragon dictation along with smaller phrase technology. Any transcriptional errors that result from this process are unintentional.

## 2017-02-20 ENCOUNTER — Ambulatory Visit (INDEPENDENT_AMBULATORY_CARE_PROVIDER_SITE_OTHER): Payer: 59 | Admitting: Family Medicine

## 2017-02-20 ENCOUNTER — Encounter: Payer: Self-pay | Admitting: Family Medicine

## 2017-02-20 DIAGNOSIS — Z23 Encounter for immunization: Secondary | ICD-10-CM | POA: Diagnosis not present

## 2017-02-20 DIAGNOSIS — S62639A Displaced fracture of distal phalanx of unspecified finger, initial encounter for closed fracture: Secondary | ICD-10-CM | POA: Diagnosis not present

## 2017-02-20 DIAGNOSIS — S62609A Fracture of unspecified phalanx of unspecified finger, initial encounter for closed fracture: Secondary | ICD-10-CM

## 2017-02-20 NOTE — Assessment & Plan Note (Signed)
Patient is doing relatively well. Decline formal physical therapy. Given home exercises working on squeezing the bone. Likely will improve completely in the next several weeks. May always have some swelling and some very mild angulation of the finger. Patient is okay with this. Flexor tendon is intact. His lungs patient improves will follow-up as needed

## 2017-02-20 NOTE — Patient Instructions (Signed)
Good to see you   

## 2017-04-03 ENCOUNTER — Ambulatory Visit (INDEPENDENT_AMBULATORY_CARE_PROVIDER_SITE_OTHER): Payer: 59

## 2017-04-03 DIAGNOSIS — Z23 Encounter for immunization: Secondary | ICD-10-CM | POA: Diagnosis not present

## 2017-07-05 IMAGING — MR MR FOOT*L* WO/W CM
5 of 9 series · 19 of 40 positions shown · IV contrast (multihance)
Comparison: Radiographs 09/21/2015

CLINICAL DATA: Plantar pain for 6 months.

EXAM:
MRI OF THE LEFT FOREFOOT WITHOUT AND WITH CONTRAST
TECHNIQUE: Multiplanar, multisequence MR imaging was performed both before and
after administration of intravenous contrast.
CONTRAST:  18 cc MultiHance

[Series 4: T1 · coronal · 4.0mm · 0.29mm/px · 6 of 30 slices shown (1 of 2)]
[im 1/30]
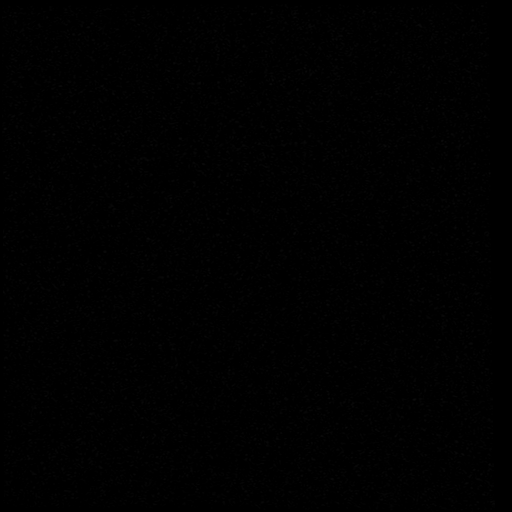
[im 6/30]
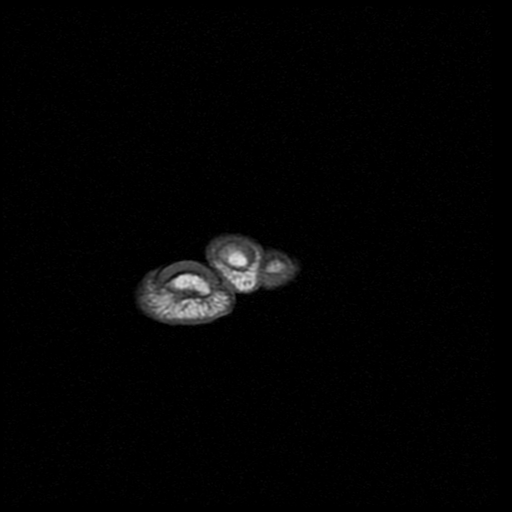
[im 12/30]
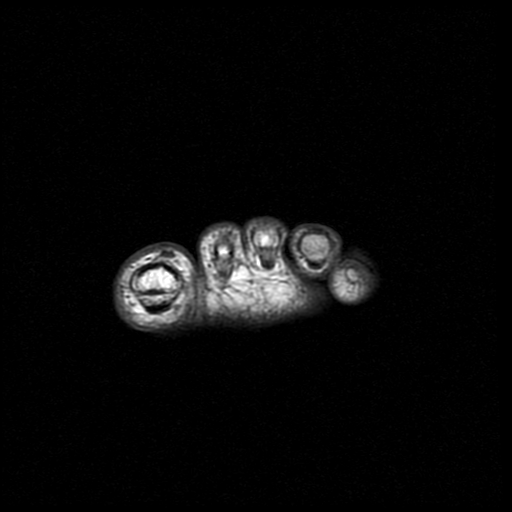
[im 18/30]
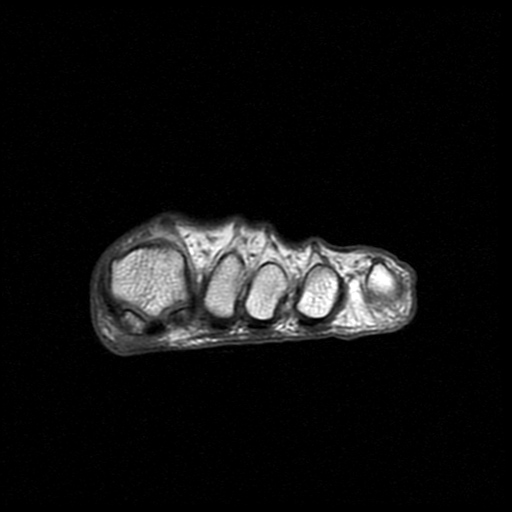
[im 24/30]
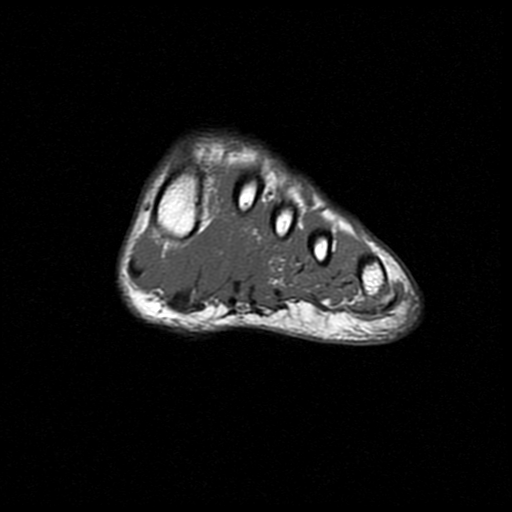
[im 30/30]
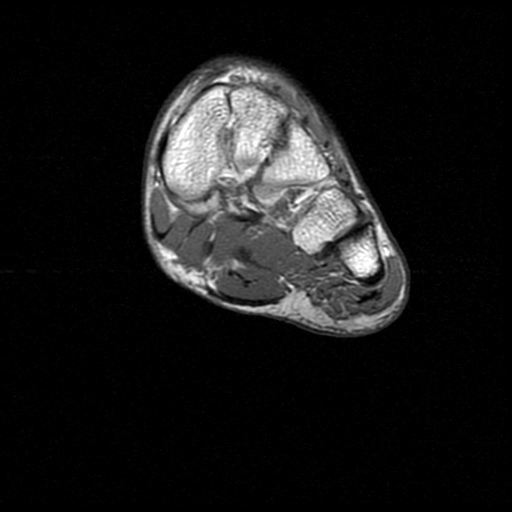

[Series 7: T1 · axial · 4.0mm · 0.29mm/px · z∈[-85,+5]mm · 3 of 19 slices shown (2 of 2)]
[im 1/19]
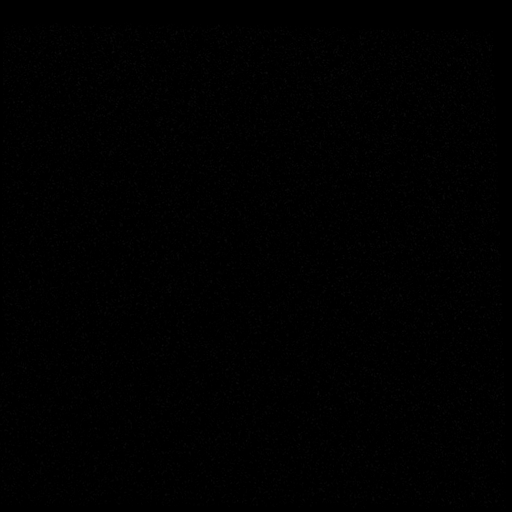
[im 10/19]
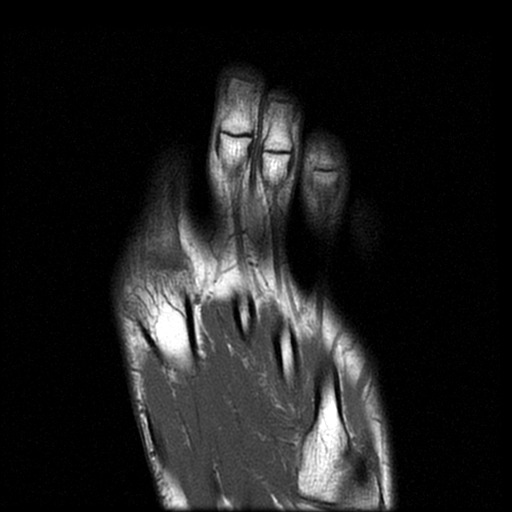
[im 19/19]
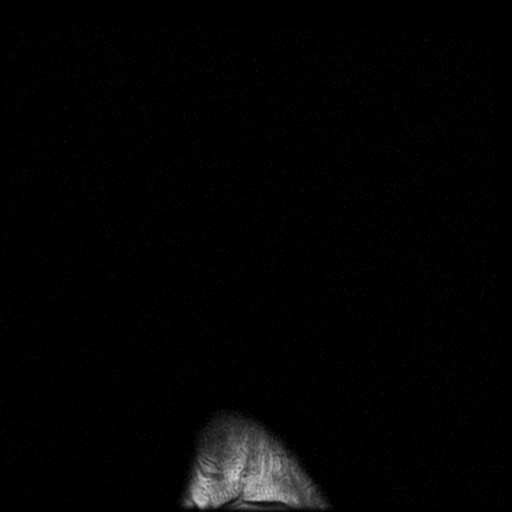

[Series 10: T1 fat-sat post-contrast · coronal · 4.0mm · 0.29mm/px · 5 of 30 slices shown]
[im 1/30]
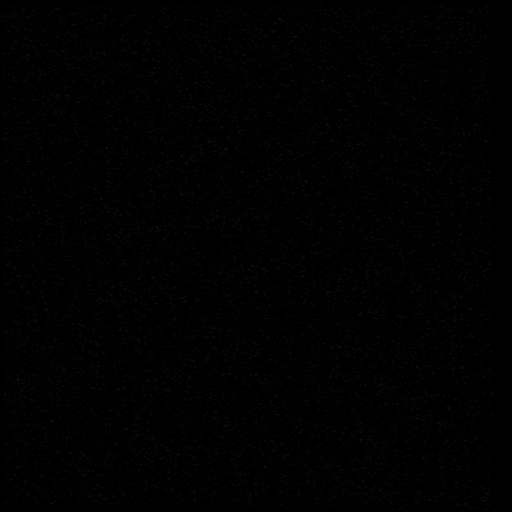
[im 8/30]
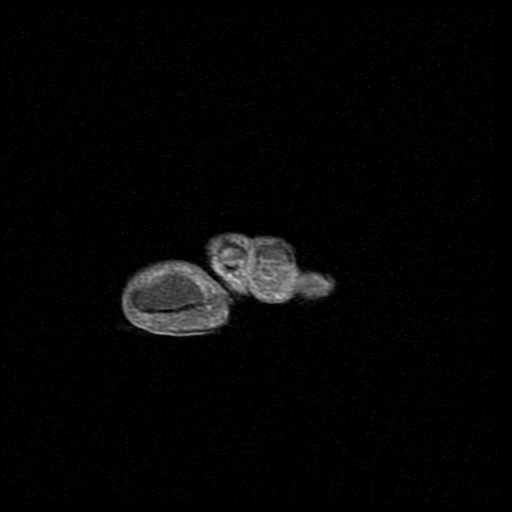
[im 15/30]
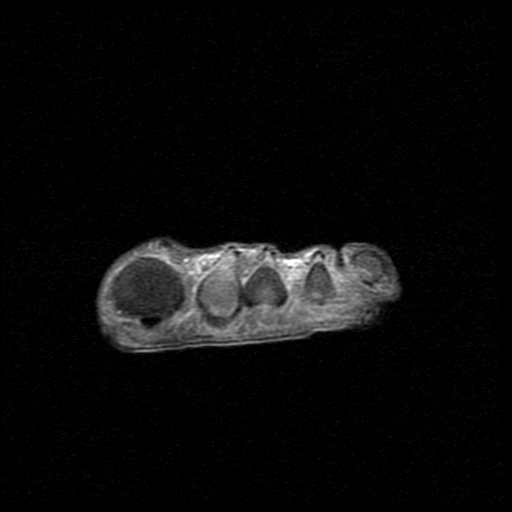
[im 22/30]
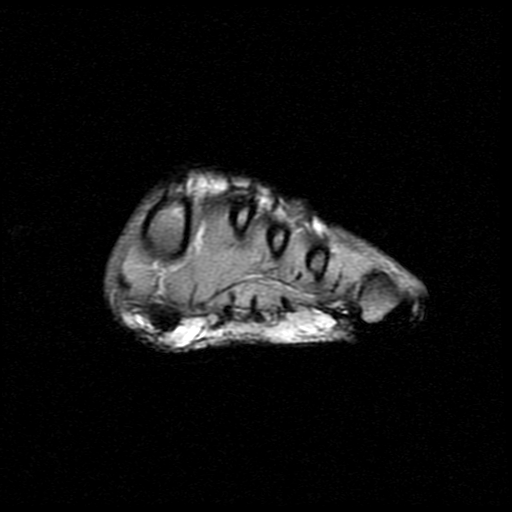
[im 30/30]
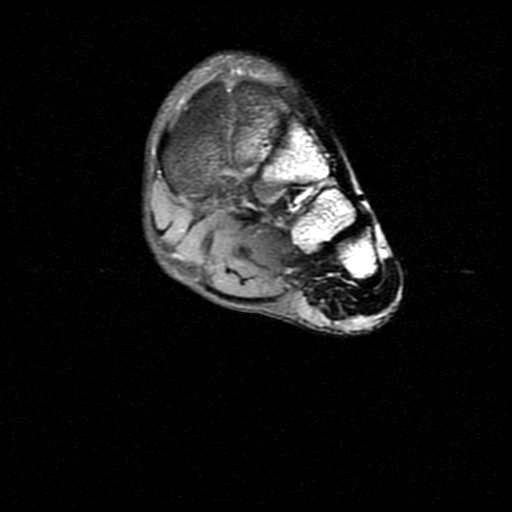

[Series 11: T1 post-contrast · axial · 4.0mm · 0.29mm/px · z∈[-85,+5]mm · 3 of 19 slices shown (1 of 2)]
[im 1/19]
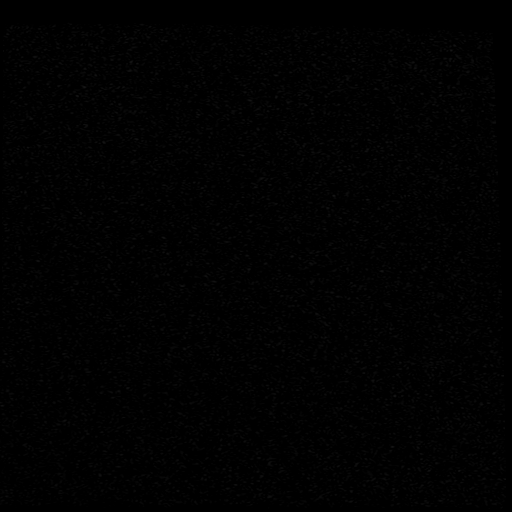
[im 10/19]
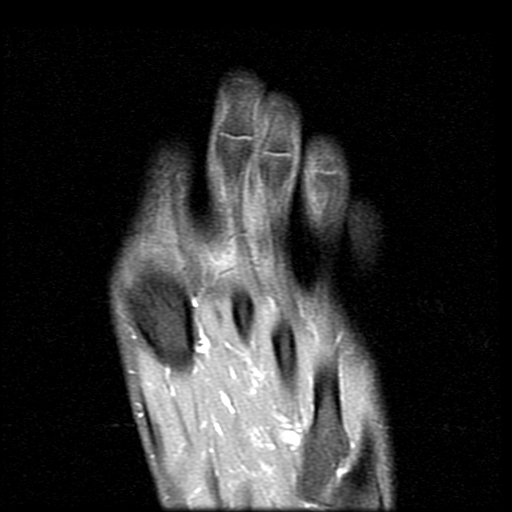
[im 19/19]
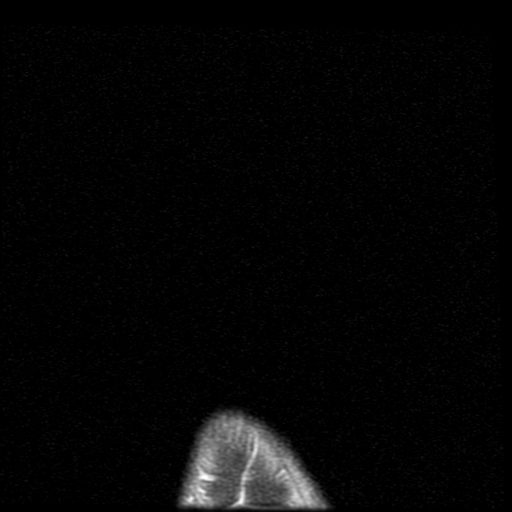

[Series 12: T1 post-contrast · sagittal · 4.0mm · 0.29mm/px · 2 of 23 slices shown (2 of 2)]
[im 1/23]
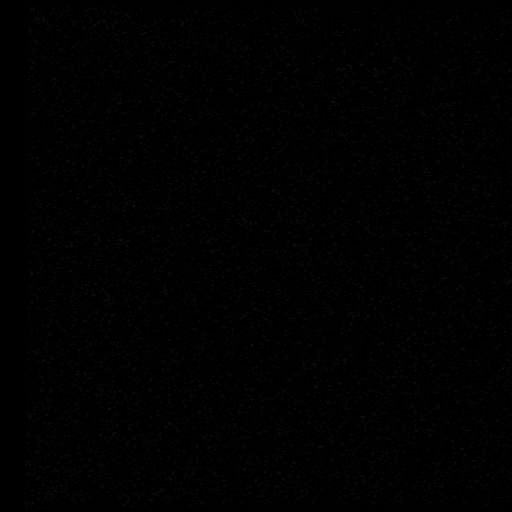
[im 8/23]
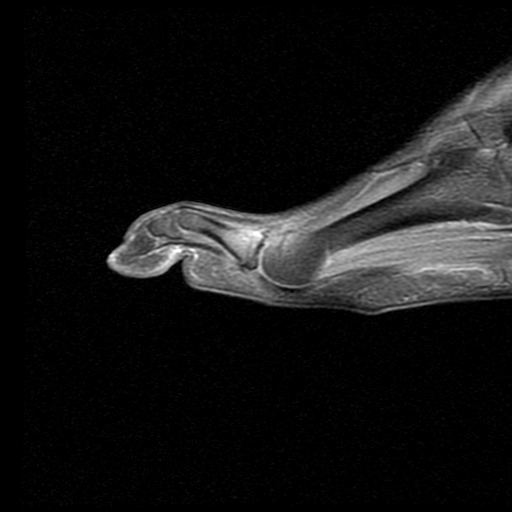

[19 of 40 positions shown; findings below may reference images not displayed]

FINDINGS: No acute bony findings. No stress fracture or avascular necrosis. No
significant degenerative changes or arthropathic findings. Mild
first MTP joint degenerative changes and hallux valgus deformity.
Mild midfoot degenerative changes. The foot musculature appears
normal.

Small fluid collection between the third and fourth metatarsal heads
suggesting intermetatarsal bursitis.

No enhancing soft tissue lesions are identified. No findings
suspicious for a Morton's neuroma. This
IMPRESSION: 1. No acute bony findings.  Mild degenerative changes.
2. Intermetatarsal bursitis between the third and fourth metatarsal
heads.
3. No plantar mass is identified. The entire plantar fascia is not
visualized.

## 2017-11-13 ENCOUNTER — Encounter: Payer: 59 | Admitting: Family Medicine

## 2017-12-18 ENCOUNTER — Encounter: Payer: Self-pay | Admitting: Family Medicine

## 2017-12-18 ENCOUNTER — Ambulatory Visit (INDEPENDENT_AMBULATORY_CARE_PROVIDER_SITE_OTHER): Payer: 59 | Admitting: Family Medicine

## 2017-12-18 VITALS — BP 108/78 | HR 62 | Temp 97.9°F | Ht 72.0 in | Wt 185.4 lb

## 2017-12-18 DIAGNOSIS — Z0000 Encounter for general adult medical examination without abnormal findings: Secondary | ICD-10-CM

## 2017-12-18 LAB — CBC
HCT: 43.9 % (ref 39.0–52.0)
Hemoglobin: 14.9 g/dL (ref 13.0–17.0)
MCHC: 33.8 g/dL (ref 30.0–36.0)
MCV: 90.7 fl (ref 78.0–100.0)
PLATELETS: 316 10*3/uL (ref 150.0–400.0)
RBC: 4.84 Mil/uL (ref 4.22–5.81)
RDW: 13.1 % (ref 11.5–15.5)
WBC: 8.5 10*3/uL (ref 4.0–10.5)

## 2017-12-18 LAB — COMPREHENSIVE METABOLIC PANEL
ALK PHOS: 61 U/L (ref 39–117)
ALT: 14 U/L (ref 0–53)
AST: 15 U/L (ref 0–37)
Albumin: 4 g/dL (ref 3.5–5.2)
BILIRUBIN TOTAL: 1.2 mg/dL (ref 0.2–1.2)
BUN: 20 mg/dL (ref 6–23)
CALCIUM: 9.6 mg/dL (ref 8.4–10.5)
CO2: 29 mEq/L (ref 19–32)
Chloride: 105 mEq/L (ref 96–112)
Creatinine, Ser: 1 mg/dL (ref 0.40–1.50)
GFR: 81.52 mL/min (ref >60.00)
Glucose, Bld: 85 mg/dL (ref 70–99)
Potassium: 4.3 mEq/L (ref 3.5–5.1)
Sodium: 139 mEq/L (ref 135–145)
TOTAL PROTEIN: 6.3 g/dL (ref 6.0–8.3)

## 2017-12-18 LAB — LIPID PANEL
Cholesterol: 173 mg/dL (ref 0–200)
HDL: 49 mg/dL (ref >39.00)
LDL Cholesterol: 103 mg/dL — ABNORMAL HIGH (ref 0–99)
NonHDL: 123.95
Total CHOL/HDL Ratio: 4
Triglycerides: 104 mg/dL (ref 0.0–149.0)
VLDL: 20.8 mg/dL (ref 0.0–40.0)

## 2017-12-18 NOTE — Patient Instructions (Addendum)
Keep up the good work.   Give [place] 2-3 business days to get the results of your labs back.   Let us know if you need anything.

## 2017-12-18 NOTE — Progress Notes (Signed)
Chief Complaint  Patient presents with  . Annual Exam    Well Male Brian Lynch is here for a complete physical.   His last physical was >1 year ago.  Current diet: in general, a "healthy" diet.  Current exercise: basketball Weight trend: stable Does pt snore? Yes- no apneic episodes. Daytime fatigue? No.  Seat belt? Yes.    Health maintenance Shingrix- Yes Colonoscopy- Yes  Tetanus- Yes HIV- Yes Hep C- Yes Lung cancer screening- N/A Prostate cancer screening- No   Past Medical History:  Diagnosis Date  . Foot pain    Podiatrist: sesamoiditis and pes planus, enthesopathy of ankle/foot, osteoarthritis ankle/foot      Past Surgical History:  Procedure Laterality Date  . COLONOSCOPY  08/03/10   Normal (small internal hemorrhoids).  Repeat in 10 yrs.    Medications  Current Outpatient Medications on File Prior to Visit  Medication Sig Dispense Refill  . Omega-3 Fatty Acids (FISH OIL) 1000 MG CAPS Take 1 capsule by mouth daily.      . Probiotic Product (PROBIOTIC DAILY PO) Take by mouth.    . traMADol (ULTRAM) 50 MG tablet Take 1 tablet (50 mg total) by mouth every 8 (eight) hours as needed. (Patient not taking: Reported on 12/18/2017) 30 tablet 0   Allergies No Known Allergies  Family History Family History  Problem Relation Age of Onset  . Atrial fibrillation Mother   . Heart failure Mother   . COPD Mother   . Glaucoma Mother   . Stroke Father   . Aneurysm Father        AAA    Review of Systems: Constitutional:  no fevers Eye:  no recent significant change in vision Ear/Nose/Mouth/Throat:  Ears:  no hearing loss Nose/Mouth/Throat:  no complaints of nasal congestion, no sore throat Cardiovascular:  no chest pain, no palpitations Respiratory:  no cough and no shortness of breath Gastrointestinal:  no abdominal pain, no change in bowel habits GU:  Male: negative for dysuria, frequency, and incontinence and negative for prostate  symptoms Musculoskeletal/Extremities: +chronic foot pain; otherwise no pain, redness, or swelling of the joints Integumentary (Skin/Breast):  no abnormal skin lesions reported Neurologic:  no headaches Endocrine: No unexpected weight changes Hematologic/Lymphatic:  no abnormal bleeding  Exam BP 108/78 (BP Location: Left Arm, Patient Position: Sitting, Cuff Size: Normal)   Pulse 62   Temp 97.9 F (36.6 C) (Oral)   Ht 6' (1.829 m)   Wt 185 lb 6 oz (84.1 kg)   SpO2 97%   BMI 25.14 kg/m  General:  well developed, well nourished, in no apparent distress Skin:  no significant moles, warts, or growths Head:  no masses, lesions, or tenderness Eyes:  pupils equal and round, sclera anicteric without injection Ears:  canals without lesions, TMs shiny without retraction, no obvious effusion, no erythema Nose:  nares patent, septum midline, mucosa normal Throat/Pharynx:  lips and gingiva without lesion; tongue and uvula midline; non-inflamed pharynx; no exudates or postnasal drainage Neck: neck supple without adenopathy, thyromegaly, or masses Lungs:  clear to auscultation, breath sounds equal bilaterally, no respiratory distress Cardio:  regular rate and rhythm, no LE edema, no bruits Abdomen:  abdomen soft, nontender; bowel sounds normal; no masses or organomegaly Genital (male): circumcised penis, no lesions or discharge; testes present bilaterally without masses or tenderness Rectal: Deferred Musculoskeletal:  symmetrical muscle groups noted without atrophy or deformity Extremities:  no clubbing, cyanosis, or edema, no deformities, no skin discoloration Neuro:  gait normal; deep  tendon reflexes normal and symmetric Psych: well oriented with normal range of affect and appropriate judgment/insight  Assessment and Plan  Well adult exam - Plan: CBC, Lipid panel, Comprehensive metabolic panel   Well 57 y.o. male. Counseled on diet and exercise. If he changes mind about seeing a foot/ankle  specialist, he will let us know.  Counseled on risks and benefits of prostate cancer screening with PSA. The patient agrees to forego testing. To let us know if he changes his mind.  Will get flu shot this fall. Follow up in 1 yr or prn. The patient voiced understanding and agreement to the plan.  [place], DO 12/18/17 7:14 AM

## 2017-12-18 NOTE — Progress Notes (Signed)
Pre visit review using our clinic review tool, if applicable. No additional management support is needed unless otherwise documented below in the visit note. 

## 2018-06-13 ENCOUNTER — Telehealth: Payer: Self-pay | Admitting: Family

## 2018-06-13 MED ORDER — OSELTAMIVIR PHOSPHATE 75 MG PO CAPS: 75.0000 mg | ORAL_CAPSULE | Freq: Every day | ORAL | 0 refills | Status: AC

## 2018-06-13 NOTE — Telephone Encounter (Signed)
Daughter diagnosed with flu A. Requesting rx for prophylactic tamiflu.

## 2018-09-19 IMAGING — DX DG FINGER LITTLE 2+V*R*
3 series · 3 of 3 positions shown · non-contrast
Comparison: 01/17/2017

CLINICAL DATA: Followup fracture-dislocation little finger

EXAM:
RIGHT LITTLE FINGER 2+V

[finger ap]
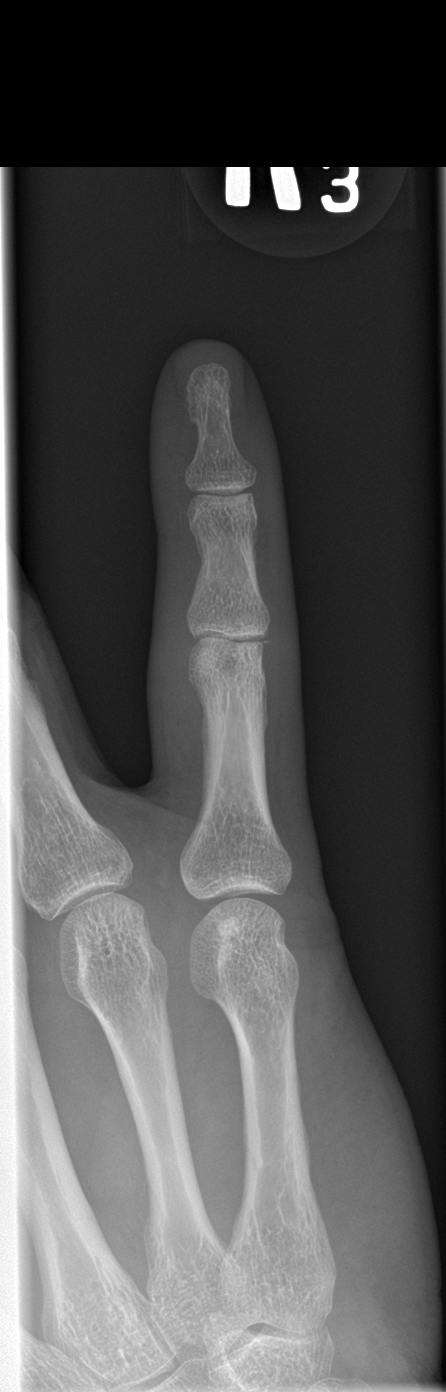

[finger obl]
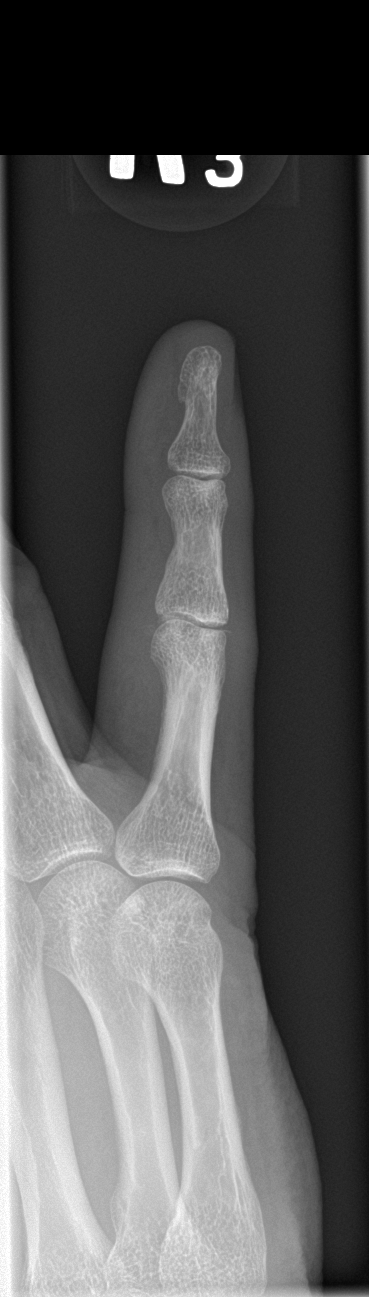

[finger lat]
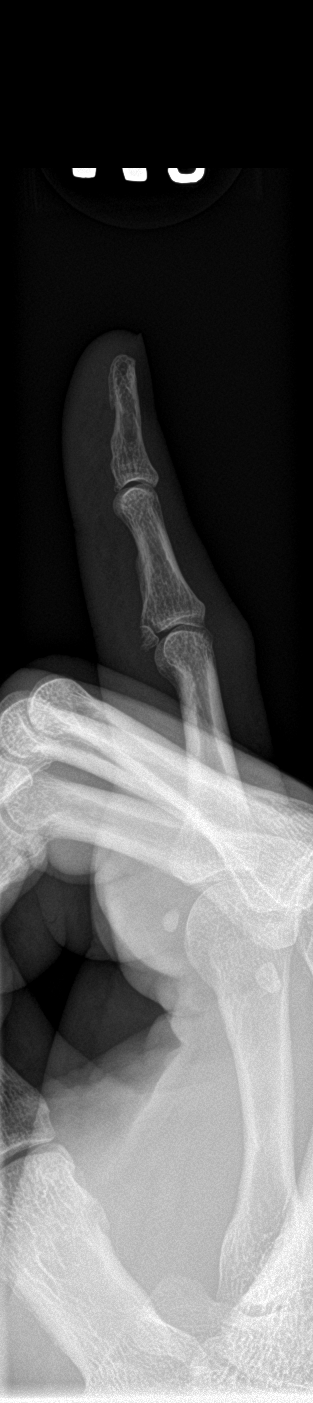

[3 of 3 positions shown; findings below may reference images not displayed]

FINDINGS: Osseous mineralization normal.

Joint spaces preserved.

Diffuse soft tissue swelling RIGHT little finger.

Small fracture fragment at the volar aspect of the base of the
middle phalanx with intra-articular extension at the PIP joint, with
fracture fragment rotated.

Additional tiny avulsion fragment at the ulnar margin of the PIP
joint.

No additional fracture, dislocation or bone destruction.
IMPRESSION: Volar plate fracture at base of middle phalanx RIGHT little finger
with intra-articular extension and rotation of the fracture
fragment.

Additional tiny avulsion fragment at the ulnar margin of the PIP
joint.

## 2018-12-26 ENCOUNTER — Other Ambulatory Visit: Payer: Self-pay

## 2018-12-26 ENCOUNTER — Other Ambulatory Visit: Payer: Self-pay | Admitting: Family Medicine

## 2018-12-26 DIAGNOSIS — Z20822 Contact with and (suspected) exposure to covid-19: Secondary | ICD-10-CM

## 2018-12-26 DIAGNOSIS — Z20828 Contact with and (suspected) exposure to other viral communicable diseases: Secondary | ICD-10-CM

## 2018-12-26 NOTE — Progress Notes (Signed)
Pt exposed to sick contact, discussed w wife and will go ahead with screening.

## 2018-12-27 LAB — NOVEL CORONAVIRUS, NAA: SARS-CoV-2, NAA: NOT DETECTED

## 2019-01-22 ENCOUNTER — Other Ambulatory Visit: Payer: Self-pay

## 2019-01-22 ENCOUNTER — Ambulatory Visit (INDEPENDENT_AMBULATORY_CARE_PROVIDER_SITE_OTHER): Payer: 59 | Admitting: *Deleted

## 2019-01-22 DIAGNOSIS — Z23 Encounter for immunization: Secondary | ICD-10-CM | POA: Diagnosis not present

## 2019-01-22 NOTE — Progress Notes (Signed)
Patient here today for flu vaccine.  Vaccine given and patient tolerated well. 

## 2019-01-23 ENCOUNTER — Ambulatory Visit: Payer: 59

## 2019-04-22 ENCOUNTER — Other Ambulatory Visit: Payer: Self-pay | Admitting: Family Medicine

## 2019-04-22 DIAGNOSIS — Z20822 Contact with and (suspected) exposure to covid-19: Secondary | ICD-10-CM

## 2020-02-18 ENCOUNTER — Ambulatory Visit (INDEPENDENT_AMBULATORY_CARE_PROVIDER_SITE_OTHER): Payer: 59 | Admitting: *Deleted

## 2020-02-18 ENCOUNTER — Other Ambulatory Visit: Payer: Self-pay

## 2020-02-18 DIAGNOSIS — Z23 Encounter for immunization: Secondary | ICD-10-CM | POA: Diagnosis not present

## 2020-03-03 ENCOUNTER — Other Ambulatory Visit (HOSPITAL_BASED_OUTPATIENT_CLINIC_OR_DEPARTMENT_OTHER): Payer: Self-pay | Admitting: Internal Medicine

## 2020-03-03 ENCOUNTER — Ambulatory Visit: Payer: 59 | Attending: Internal Medicine

## 2020-03-03 DIAGNOSIS — Z23 Encounter for immunization: Secondary | ICD-10-CM

## 2020-03-03 NOTE — Progress Notes (Signed)
   Covid-19 Vaccination Clinic  Name:  CAMERYN CHRISLEY    MRN: [id number] DOB: October 17, 1960  03/03/2020  Mr. Moening was observed post Covid-19 immunization for 15 minutes without incident. He was provided with Vaccine Information Sheet and instruction to access the V-Safe system.   Mr. Schwandt was instructed to call 911 with any severe reactions post vaccine: Marland Kitchen Difficulty breathing  . Swelling of face and throat  . A fast heartbeat  . A bad rash all over body  . Dizziness and weakness

## 2020-03-12 MED FILL — PFIZER-BIONTECH COVID-19 VA: 30 | 1 days supply | Qty: 0 | Fill #0

## 2020-08-03 ENCOUNTER — Other Ambulatory Visit: Payer: Self-pay | Admitting: Family Medicine

## 2020-08-03 ENCOUNTER — Encounter: Payer: Self-pay | Admitting: Family Medicine

## 2020-08-03 ENCOUNTER — Other Ambulatory Visit: Payer: Self-pay

## 2020-08-03 ENCOUNTER — Ambulatory Visit (INDEPENDENT_AMBULATORY_CARE_PROVIDER_SITE_OTHER): Payer: 59 | Admitting: Family Medicine

## 2020-08-03 VITALS — BP 120/82 | HR 66 | Temp 98.0°F | Ht 72.0 in | Wt 188.5 lb

## 2020-08-03 DIAGNOSIS — Z1211 Encounter for screening for malignant neoplasm of colon: Secondary | ICD-10-CM | POA: Diagnosis not present

## 2020-08-03 DIAGNOSIS — Z0000 Encounter for general adult medical examination without abnormal findings: Secondary | ICD-10-CM | POA: Diagnosis not present

## 2020-08-03 DIAGNOSIS — R7989 Other specified abnormal findings of blood chemistry: Secondary | ICD-10-CM

## 2020-08-03 LAB — COMPREHENSIVE METABOLIC PANEL
ALT: 58 U/L — ABNORMAL HIGH (ref 0–53)
AST: 33 U/L (ref 0–37)
Albumin: 3.8 g/dL (ref 3.5–5.2)
Alkaline Phosphatase: 69 U/L (ref 39–117)
BUN: 20 mg/dL (ref 6–23)
CO2: 28 mEq/L (ref 19–32)
Calcium: 8.9 mg/dL (ref 8.4–10.5)
Chloride: 106 mEq/L (ref 96–112)
Creatinine, Ser: 0.82 mg/dL (ref 0.40–1.50)
GFR: 95.28 mL/min (ref >60.00)
Glucose, Bld: 78 mg/dL (ref 70–99)
Potassium: 4.5 mEq/L (ref 3.5–5.1)
Sodium: 139 mEq/L (ref 135–145)
Total Bilirubin: 1.3 mg/dL — ABNORMAL HIGH (ref 0.2–1.2)
Total Protein: 6.2 g/dL (ref 6.0–8.3)

## 2020-08-03 LAB — CBC
HCT: 41 % (ref 39.0–52.0)
Hemoglobin: 13.6 g/dL (ref 13.0–17.0)
MCHC: 33.2 g/dL (ref 30.0–36.0)
MCV: 89.7 fl (ref 78.0–100.0)
Platelets: 323 10*3/uL (ref 150.0–400.0)
RBC: 4.57 Mil/uL (ref 4.22–5.81)
RDW: 13.2 % (ref 11.5–15.5)
WBC: 9.4 10*3/uL (ref 4.0–10.5)

## 2020-08-03 LAB — LIPID PANEL
Cholesterol: 150 mg/dL (ref 0–200)
HDL: 34.9 mg/dL — ABNORMAL LOW (ref >39.00)
LDL Cholesterol: 94 mg/dL (ref 0–99)
NonHDL: 115.42
Total CHOL/HDL Ratio: 4
Triglycerides: 108 mg/dL (ref 0.0–149.0)
VLDL: 21.6 mg/dL (ref 0.0–40.0)

## 2020-08-03 NOTE — Patient Instructions (Addendum)
Give us 2-3 business days to get the results of your labs back.   Keep the diet clean and stay active.  If you do not hear anything about your referral in the next 1-2 weeks, call our office and ask for an update.  Let us know if you need anything. 

## 2020-08-03 NOTE — Progress Notes (Signed)
Chief Complaint  Patient presents with  . Annual Exam    Well Male Brian Lynch is here for a complete physical.  His last physical was >1 year ago.  Current diet: in general, a "healthy" diet.  Current exercise: basketball, walking Weight trend: stable Fatigue out of ordinary? No. Seat belt? Yes.   Loss of interest in activities or depression in last 2 weeks? No  Health maintenance Shingrix- Yes Colonoscopy- Yes Tetanus- Yes HIV- Yes Hep C- Yes Lung cancer screening- No   Past Medical History:  Diagnosis Date  . Foot pain    Podiatrist: sesamoiditis and pes planus, enthesopathy of ankle/foot, osteoarthritis ankle/foot      Past Surgical History:  Procedure Laterality Date  . COLONOSCOPY  08/03/10   Normal (small internal hemorrhoids).  Repeat in 10 yrs.    Medications  Current Outpatient Medications on File Prior to Visit  Medication Sig Dispense Refill  . COVID-19 mRNA vaccine, Pfizer, 30 MCG/0.3ML injection INJECT AS DIRECTED .3 mL 0  . Omega-3 Fatty Acids (FISH OIL) 1000 MG CAPS Take 1 capsule by mouth daily.    . Probiotic Product (PROBIOTIC DAILY PO) Take by mouth.      Allergies No Known Allergies  Family History Family History  Problem Relation Age of Onset  . Atrial fibrillation Mother   . Heart failure Mother   . COPD Mother   . Glaucoma Mother   . Stroke Father   . Aneurysm Father        AAA    Review of Systems: Constitutional:  no fevers Eye:  no recent significant change in vision Ear/Nose/Mouth/Throat:  Ears:  no hearing loss Nose/Mouth/Throat:  no complaints of nasal congestion, no sore throat Cardiovascular:  no chest pain Respiratory:  no shortness of breath Gastrointestinal:  no change in bowel habits GU:  Male: negative for dysuria, +nocturnal frequency Musculoskeletal/Extremities:  No new joint pain Integumentary (Skin/Breast):  no abnormal skin lesions reported Neurologic:  no headaches Endocrine: No unexpected weight  changes Hematologic/Lymphatic:  no abnormal bleeding  Exam BP 120/82 (BP Location: Left Arm, Patient Position: Sitting, Cuff Size: Normal)   Pulse 66   Temp 98 F (36.7 C) (Oral)   Ht 6' (1.829 m)   Wt 188 lb 8 oz (85.5 kg)   SpO2 98%   BMI 25.57 kg/m  General:  well developed, well nourished, in no apparent distress Skin: various angiomas and SK's on back; otherwise no significant moles, warts, or growths Head:  no masses, lesions, or tenderness Eyes:  pupils equal and round, sclera anicteric without injection Ears:  canals without lesions, TMs shiny without retraction, no obvious effusion, no erythema Nose:  nares patent, septum midline, mucosa normal Throat/Pharynx:  lips and gingiva without lesion; tongue and uvula midline; non-inflamed pharynx; no exudates or postnasal drainage Neck: neck supple without adenopathy, thyromegaly, or masses Cardiac: RRR, no bruits, no LE edema Lungs:  clear to auscultation, breath sounds equal bilaterally, no respiratory distress Rectal: Deferred Musculoskeletal:  symmetrical muscle groups noted without atrophy or deformity Neuro:  gait normal; deep tendon reflexes normal and symmetric Psych: well oriented with normal range of affect and appropriate judgment/insight  Assessment and Plan  Well adult exam - Plan: Comprehensive metabolic panel, Lipid panel, CBC  Screen for colon cancer - Plan: Ambulatory referral to Gastroenterology   Well 60 y.o. male. Counseled on diet and exercise. Counseled on risks and benefits of prostate cancer screening with PSA. The patient agrees to undergo testing. Immunizations, labs,  and further orders as above. Follow up in 1 yr or prn. The patient voiced understanding and agreement to the plan.  [place], DO 08/03/20 8:20 AM

## 2020-08-25 ENCOUNTER — Other Ambulatory Visit (INDEPENDENT_AMBULATORY_CARE_PROVIDER_SITE_OTHER): Payer: 59

## 2020-08-25 ENCOUNTER — Other Ambulatory Visit: Payer: Self-pay

## 2020-08-25 DIAGNOSIS — R7989 Other specified abnormal findings of blood chemistry: Secondary | ICD-10-CM | POA: Diagnosis not present

## 2020-08-25 LAB — HEPATIC FUNCTION PANEL
ALT: 39 U/L (ref 0–53)
AST: 29 U/L (ref 0–37)
Albumin: 3.7 g/dL (ref 3.5–5.2)
Alkaline Phosphatase: 62 U/L (ref 39–117)
Bilirubin, Direct: 0.2 mg/dL (ref 0.0–0.3)
Total Bilirubin: 1.2 mg/dL (ref 0.2–1.2)
Total Protein: 6.4 g/dL (ref 6.0–8.3)

## 2020-09-14 ENCOUNTER — Ambulatory Visit: Payer: 59 | Attending: Internal Medicine

## 2020-09-14 DIAGNOSIS — Z23 Encounter for immunization: Secondary | ICD-10-CM

## 2020-09-14 NOTE — Progress Notes (Signed)
   Covid-19 Vaccination Clinic  Name:  CORNELL BOURBON    MRN: [id number] DOB: [birth year]  09/14/2020  Mr. Woolbright was observed post Covid-19 immunization for 15 minutes without incident. He was provided with Vaccine Information Sheet and instruction to access the V-Safe system.   Mr. Breaker was instructed to call 911 with any severe reactions post vaccine: Marland Kitchen Difficulty breathing  . Swelling of face and throat  . A fast heartbeat  . A bad rash all over body  . Dizziness and weakness   Immunizations Administered    Name Date Dose VIS Date Route   PFIZER Comrnaty(Gray TOP) Covid-19 Vaccine 09/14/2020  9:05 AM 0.3 mL 04/09/2020 Intramuscular   Manufacturer: [employer]   Lot: [id number]   NDC: [phone]

## 2020-09-15 ENCOUNTER — Other Ambulatory Visit (HOSPITAL_BASED_OUTPATIENT_CLINIC_OR_DEPARTMENT_OTHER): Payer: Self-pay

## 2020-09-15 MED ORDER — PFIZER-BIONT COVID-19 VAC-TRIS 30 MCG/0.3ML IM SUSP
INTRAMUSCULAR | 0 refills | Status: DC
  Filled 2020-09-15: qty 0.3, 1d supply, fill #0

## 2020-09-30 ENCOUNTER — Ambulatory Visit: Payer: 59 | Attending: Critical Care Medicine

## 2020-09-30 DIAGNOSIS — Z20822 Contact with and (suspected) exposure to covid-19: Secondary | ICD-10-CM | POA: Diagnosis not present

## 2020-10-01 LAB — SARS-COV-2, NAA 2 DAY TAT

## 2020-10-01 LAB — NOVEL CORONAVIRUS, NAA: SARS-CoV-2, NAA: NOT DETECTED

## 2020-12-29 ENCOUNTER — Other Ambulatory Visit (HOSPITAL_BASED_OUTPATIENT_CLINIC_OR_DEPARTMENT_OTHER): Payer: Self-pay

## 2020-12-29 MED ORDER — CARESTART COVID-19 HOME TEST VI KIT
PACK | 0 refills | Status: DC
  Filled 2020-12-29: qty 2, 4d supply, fill #0

## 2021-02-01 ENCOUNTER — Ambulatory Visit: Payer: 59 | Attending: Internal Medicine

## 2021-02-01 DIAGNOSIS — Z23 Encounter for immunization: Secondary | ICD-10-CM

## 2021-02-01 NOTE — Progress Notes (Signed)
   Covid-19 Vaccination Clinic  Name:  ROCHELL MABIE    MRN: [id number] DOB: 1961/04/26  02/01/2021  Mr. Ramires was observed post Covid-19 immunization for 15 minutes without incident. He was provided with Vaccine Information Sheet and instruction to access the V-Safe system.   Mr. Mucci was instructed to call 911 with any severe reactions post vaccine: Difficulty breathing  Swelling of face and throat  A fast heartbeat  A bad rash all over body  Dizziness and weakness

## 2021-02-09 ENCOUNTER — Other Ambulatory Visit (HOSPITAL_BASED_OUTPATIENT_CLINIC_OR_DEPARTMENT_OTHER): Payer: Self-pay

## 2021-02-09 MED ORDER — COVID-19MRNA BIVAL VACC PFIZER 30 MCG/0.3ML IM SUSP
INTRAMUSCULAR | 0 refills | Status: DC
  Filled 2021-02-09: qty 0.3, 1d supply, fill #0

## 2021-02-16 ENCOUNTER — Ambulatory Visit (INDEPENDENT_AMBULATORY_CARE_PROVIDER_SITE_OTHER): Payer: 59

## 2021-02-16 ENCOUNTER — Other Ambulatory Visit: Payer: Self-pay

## 2021-02-16 DIAGNOSIS — Z23 Encounter for immunization: Secondary | ICD-10-CM

## 2021-02-16 NOTE — Progress Notes (Signed)
Pt came in for flu shot today and tolerated well.

## 2021-05-06 ENCOUNTER — Other Ambulatory Visit (HOSPITAL_BASED_OUTPATIENT_CLINIC_OR_DEPARTMENT_OTHER): Payer: Self-pay

## 2021-05-06 MED ORDER — CARESTART COVID-19 HOME TEST VI KIT
PACK | 0 refills | Status: DC
  Filled 2021-05-06: qty 4, 4d supply, fill #0

## 2021-08-16 ENCOUNTER — Encounter: Payer: 59 | Admitting: Family Medicine

## 2021-12-29 ENCOUNTER — Ambulatory Visit (INDEPENDENT_AMBULATORY_CARE_PROVIDER_SITE_OTHER): Payer: 59 | Admitting: Family Medicine

## 2021-12-29 ENCOUNTER — Encounter: Payer: Self-pay | Admitting: Family Medicine

## 2021-12-29 VITALS — BP 110/72 | HR 65 | Temp 97.8°F | Ht 72.0 in | Wt 194.1 lb

## 2021-12-29 DIAGNOSIS — Z1211 Encounter for screening for malignant neoplasm of colon: Secondary | ICD-10-CM

## 2021-12-29 DIAGNOSIS — G8929 Other chronic pain: Secondary | ICD-10-CM

## 2021-12-29 DIAGNOSIS — M79671 Pain in right foot: Secondary | ICD-10-CM | POA: Diagnosis not present

## 2021-12-29 DIAGNOSIS — Z125 Encounter for screening for malignant neoplasm of prostate: Secondary | ICD-10-CM | POA: Diagnosis not present

## 2021-12-29 DIAGNOSIS — Z0000 Encounter for general adult medical examination without abnormal findings: Secondary | ICD-10-CM

## 2021-12-29 LAB — LIPID PANEL
Cholesterol: 193 mg/dL (ref 0–200)
HDL: 49.6 mg/dL (ref >39.00)
LDL Cholesterol: 112 mg/dL — ABNORMAL HIGH (ref 0–99)
NonHDL: 143.13
Total CHOL/HDL Ratio: 4
Triglycerides: 158 mg/dL — ABNORMAL HIGH (ref 0.0–149.0)
VLDL: 31.6 mg/dL (ref 0.0–40.0)

## 2021-12-29 LAB — COMPREHENSIVE METABOLIC PANEL
ALT: 25 U/L (ref 0–53)
AST: 24 U/L (ref 0–37)
Albumin: 4 g/dL (ref 3.5–5.2)
Alkaline Phosphatase: 54 U/L (ref 39–117)
BUN: 20 mg/dL (ref 6–23)
CO2: 27 mEq/L (ref 19–32)
Calcium: 9 mg/dL (ref 8.4–10.5)
Chloride: 106 mEq/L (ref 96–112)
Creatinine, Ser: 0.9 mg/dL (ref 0.40–1.50)
GFR: 91.73 mL/min (ref >60.00)
Glucose, Bld: 80 mg/dL (ref 70–99)
Potassium: 4.8 mEq/L (ref 3.5–5.1)
Sodium: 138 mEq/L (ref 135–145)
Total Bilirubin: 1.1 mg/dL (ref 0.2–1.2)
Total Protein: 6.6 g/dL (ref 6.0–8.3)

## 2021-12-29 LAB — CBC
HCT: 42.9 % (ref 39.0–52.0)
Hemoglobin: 14.2 g/dL (ref 13.0–17.0)
MCHC: 33.2 g/dL (ref 30.0–36.0)
MCV: 91.4 fl (ref 78.0–100.0)
Platelets: 291 10*3/uL (ref 150.0–400.0)
RBC: 4.69 Mil/uL (ref 4.22–5.81)
RDW: 13.1 % (ref 11.5–15.5)
WBC: 7.8 10*3/uL (ref 4.0–10.5)

## 2021-12-29 LAB — PSA: PSA: 0.42 ng/mL (ref 0.10–4.00)

## 2021-12-29 NOTE — Patient Instructions (Signed)
Give [place] 2-3 business days to get the results of your labs back.   Keep the diet clean and stay active.  I recommend getting the flu shot in mid October. This suggestion would change if the CDC comes out with a different recommendation.   Please get me a copy of your advanced directive form at your convenience.   If you do not hear anything about your referrals in the next 1-2 weeks, call our office and ask for an update.  Let us know if you need anything.

## 2021-12-29 NOTE — Progress Notes (Signed)
Chief Complaint  Patient presents with   Annual Exam    Referral colonoscopy    Well Male Brian Lynch is here for a complete physical.   His last physical was >1 year ago.  Current diet: in general, a "healthy" diet.  Current exercise: walking, playing basketball Weight trend: stable Fatigue out of ordinary? No. Seat belt? Yes.   Advanced directive? No  Health maintenance Shingrix- Yes Colonoscopy- Due Tetanus- Yes HIV- Yes Hep C- Yes   Past Medical History:  Diagnosis Date   Foot pain    Podiatrist: sesamoiditis and pes planus, enthesopathy of ankle/foot, osteoarthritis ankle/foot      Past Surgical History:  Procedure Laterality Date   COLONOSCOPY  08/03/10   Normal (small internal hemorrhoids).  Repeat in 10 yrs.    Medications  Current Outpatient Medications on File Prior to Visit  Medication Sig Dispense Refill   aspirin EC 81 MG tablet Take 81 mg by mouth daily. Swallow whole.     COVID-19 At Home Antigen Test [facility] COVID-19 HOME TEST) KIT Use as directed 2 kit 0   COVID-19 At Home Antigen Test (CARESTART COVID-19 HOME TEST) KIT Use as directed per package instructions 4 each 0   COVID-19 mRNA bivalent vaccine, Pfizer, injection Inject into the muscle. 0.3 mL 0   COVID-19 mRNA Vac-TriS, Pfizer, (PFIZER-BIONT COVID-19 VAC-TRIS) SUSP injection Inject into the muscle. 0.3 mL 0   Multiple Vitamin (MULTIVITAMIN) tablet Take 1 tablet by mouth daily.     Omega-3 Fatty Acids (FISH OIL) 1000 MG CAPS Take 1 capsule by mouth daily.     Probiotic Product (PROBIOTIC DAILY PO) Take by mouth.      Allergies No Known Allergies  Family History Family History  Problem Relation Age of Onset   Atrial fibrillation Mother    Heart failure Mother    COPD Mother    Glaucoma Mother    Stroke Father    Aneurysm Father        AAA    Review of Systems: Constitutional:  no fevers Eye:  no recent significant change in vision Ear/Nose/Mouth/Throat:  Ears:  no hearing  loss Nose/Mouth/Throat:  no complaints of nasal congestion, no sore throat Cardiovascular:  no chest pain Respiratory:  no shortness of breath Gastrointestinal:  no change in bowel habits GU:  Male: negative for dysuria, frequency Musculoskeletal/Extremities:  no joint pain Integumentary (Skin/Breast):  no abnormal skin lesions reported Neurologic:  no headaches Endocrine: No unexpected weight changes Hematologic/Lymphatic:  no abnormal bleeding  Exam BP 110/72   Pulse 65   Temp 97.8 F (36.6 C) (Oral)   Ht 6' (1.829 m)   Wt 194 lb 2 oz (88.1 kg)   SpO2 97%   BMI 26.33 kg/m  General:  well developed, well nourished, in no apparent distress Skin:  no significant moles, warts, or growths Head:  no masses, lesions, or tenderness Eyes:  pupils equal and round, sclera anicteric without injection Ears:  canals without lesions, TMs shiny without retraction, no obvious effusion, no erythema Nose:  nares patent, septum midline, mucosa normal Throat/Pharynx:  lips and gingiva without lesion; tongue and uvula midline; non-inflamed pharynx; no exudates or postnasal drainage Neck: neck supple without adenopathy, thyromegaly, or masses Cardiac: RRR, no bruits, no LE edema Lungs:  clear to auscultation, breath sounds equal bilaterally, no respiratory distress Abdomen: BS+, soft, non-tender, non-distended, no masses or organomegaly noted Rectal: Deferred Musculoskeletal:  symmetrical muscle groups noted without atrophy or deformity Neuro:  gait normal;  deep tendon reflexes normal and symmetric Psych: well oriented with normal range of affect and appropriate judgment/insight  Assessment and Plan  Well adult exam - Plan: CBC, Comprehensive metabolic panel, Lipid panel  Screen for colon cancer - Plan: Ambulatory referral to Gastroenterology  Chronic foot pain, right - Plan: Ambulatory referral to Orthopedic Surgery  Screening for prostate cancer - Plan: PSA   Well 61 y.o.  male. Counseled on diet and exercise. Advanced directive form requested today.  Counseled on risks and benefits of prostate cancer screening with PSA. The patient agrees to undergo testing. CCS: Refer back to [facility] for colonoscopy. Saw Dr. Fuller Plan in past. Chronic foot pain: Refer ortho foot/ankle. Quite a work up in past. Would like discussion of options at this time.  Immunizations, labs, and further orders as above. Follow up in 1 yr or prn. The patient voiced understanding and agreement to the plan.  [place], DO 12/29/21 10:02 AM

## 2022-01-05 ENCOUNTER — Ambulatory Visit (INDEPENDENT_AMBULATORY_CARE_PROVIDER_SITE_OTHER): Payer: 59

## 2022-01-05 ENCOUNTER — Ambulatory Visit: Payer: 59 | Admitting: Sports Medicine

## 2022-01-05 ENCOUNTER — Encounter: Payer: Self-pay | Admitting: Sports Medicine

## 2022-01-05 VITALS — BP 111/74 | HR 66

## 2022-01-05 DIAGNOSIS — M79671 Pain in right foot: Secondary | ICD-10-CM

## 2022-01-05 DIAGNOSIS — Q6671 Congenital pes cavus, right foot: Secondary | ICD-10-CM

## 2022-01-05 DIAGNOSIS — M7741 Metatarsalgia, right foot: Secondary | ICD-10-CM | POA: Diagnosis not present

## 2022-01-05 DIAGNOSIS — Q6672 Congenital pes cavus, left foot: Secondary | ICD-10-CM

## 2022-01-05 NOTE — Progress Notes (Addendum)
Brian Lynch - 61 y.o. male MRN [id number]  Date of birth: 1960-10-16  Office Visit Note: Visit Date: 01/05/2022 PCP: Shelda Pal, DO Referred by: Shelda Pal*  Subjective: Chief Complaint  Patient presents with   Right Foot - Pain   HPI: Brian Lynch is a pleasant 61 y.o. male who presents today for chronic right foot pain.  Saw podiatry back in 2017 and 2018.  He did have MRI of both the right and the left foot, which the right foot showed some bursitis of the fifth MTP joint and some mild degenerative changes, as well as intermetatarsal bursitis tween the third and fourth metatarsal head on the left foot.  At that time in July 2017 he was made custom foot orthotics and given a deep heel cup, recommended topical Voltaren.  Was also provided metatarsal offloading pads.  However, patient reports his pain is under the 2nd/3rd metatarsal. Had injury about 6 years ago when he stepped and felt a "pop" on plantar aspect of the foot. Has never been the same since that time. Feels like a pebble or marble under his foot at times. Stopped playing bball, sports because of it. Has gotten back to physical activity And sometimes doesn't bother him but other times will be painful at end of day or next day when active on feet. No redness or swelling. Did try orthotics from Podiatry years ago but unsure if these really helped much. Does not wear anything currently.  Pertinent ROS were reviewed with the patient and found to be negative unless otherwise specified above in HPI.   Assessment & Plan: Visit Diagnoses:  1. Pain in right foot   2. Pes cavus of both feet   3. Metatarsalgia, right foot    Plan: I had a discussion with [name] today regarding etiology of his pain. As this started after an acute "pop" injury, possible that he had a plantar plate rupture and is now dealing with scar tissue and sequalae of this. He is also tender over the metarsals and with his high cavus  foot we did fit him for green sports insoles with medium metarsal pads bilaterally, he felt comfortable walking with these. I could feel what felt like possible scar tissue on the plantar aspect of the 2nd met - we did perform a trial of ECSWT therapy today to see what response he gets from this. If he finds good benefit, he can return in the next 1-2 weeks for additional treatments. I would like to let him walk for 2-3 weeks with the insoles and metatarsal pads and see him back to see the response. Hopefully he has benefit, however if not, we can consider CS-injection into painful area, as well as possible longitudinal arch pains or custom orthotics to support his cavus foot.  Meds & Orders: No orders of the defined types were placed in this encounter.   Orders Placed This Encounter  Procedures   XR Foot Complete Right     Procedures:  Procedure: ECSWT Indications:  metatarsalgia, scar tissue   Procedure Details Consent: Risks of procedure as well as the alternatives and risks of each were explained to the patient.  Verbal consent for procedure obtained. Time Out: Verified patient identification, verified procedure, site was marked, verified correct patient position, medications/allergies/relevent history reviewed.  The area was cleaned with alcohol swab.     The right distal plantar fascia and lumbricals of toes 2-3 was targeted for Extracorporeal shockwave therapy.  Preset: plantar fascia Power Level: 90 Frequency: 10 Impulse/cycles: 2000 Head size: Regular   Patient tolerated procedure well without immediate complications.       Clinical History: No specialty comments available.  He reports that he has never smoked. He has never used smokeless tobacco. No results for input(s): "HGBA1C", "LABURIC" in the last 8760 hours.  Objective:   Vital Signs: BP 111/74   Pulse 66   Physical Exam  Gen: Well-appearing, in no acute distress; non-toxic CV: Regular Rate. Well-perfused.  Warm.  Resp: Breathing unlabored on room air; no wheezing. Psych: Fluid speech in conversation; appropriate affect; normal thought process Neuro: Sensation intact throughout. No gross coordination deficits.   Ortho Exam - Bilateral feet: Significant pes cavus, stays well preserved through standing. Hesitancy to transition to toes on right with gait. Small degree of supination with walking. Right foot with small nodule palpable around 2nd metatarsal on plantar side, no surrounding redness or erythema. Hammer toe of 2nd toe. FROM and strength at ankles b/l.  Imaging: XR Foot Complete Right  Result Date: 01/05/2022 3 views of the right foot including AP, lateral, oblique films were ordered and reviewed by myself. Radiographs show significant pes cavus foot. There is some bony arthritic change of medial sesamoid. No acute fracture or other bony abnormality noted.    *Independent review of MRI of the right foot on 11/11/2015 shows a fluid collection, likely bursitis at the fifth MTP joint.  There is some arthritic change of the sesamoid.  No clear evidence of Morton's neuroma.  Past Medical/Family/Surgical/Social History: Medications & Allergies reviewed per EMR, new medications updated. Patient Active Problem List   Diagnosis Date Noted   Closed fracture dislocation of distal interphalangeal joint of finger, initial encounter 01/18/2017   Loss of transverse plantar arch of right foot 04/15/2015   Sesamoiditis 04/15/2015   Nonallopathic lesion of thoracic region 02/06/2014   Nonallopathic lesion of cervical region 02/06/2014   Neck pain 01/01/2014   Nonallopathic lesion-rib cage 01/01/2014   Past Medical History:  Diagnosis Date   Foot pain    Podiatrist: sesamoiditis and pes planus, enthesopathy of ankle/foot, osteoarthritis ankle/foot   Family History  Problem Relation Age of Onset   Atrial fibrillation Mother    Heart failure Mother    COPD Mother    Glaucoma Mother    Stroke Father     Aneurysm Father        AAA   Past Surgical History:  Procedure Laterality Date   COLONOSCOPY  08/03/10   Normal (small internal hemorrhoids).  Repeat in 10 yrs.   Social History   Occupational History   Occupation: Probation officer  Tobacco Use   Smoking status: Never   Smokeless tobacco: Never  Substance and Sexual Activity   Alcohol use: Yes    Alcohol/week: 5.0 standard drinks of alcohol    Types: 5 Standard drinks or equivalent per week   Drug use: No   Sexual activity: Not on file

## 2022-01-14 ENCOUNTER — Ambulatory Visit (AMBULATORY_SURGERY_CENTER): Payer: Self-pay

## 2022-01-14 ENCOUNTER — Other Ambulatory Visit (HOSPITAL_COMMUNITY): Payer: Self-pay

## 2022-01-14 VITALS — Ht 72.0 in | Wt 192.0 lb

## 2022-01-14 DIAGNOSIS — Z1211 Encounter for screening for malignant neoplasm of colon: Secondary | ICD-10-CM

## 2022-01-14 MED ORDER — NA SULFATE-K SULFATE-MG SULF 17.5-3.13-1.6 GM/177ML PO SOLN
1.0000 | ORAL | 0 refills | Status: DC
  Filled 2022-01-14: qty 354, 1d supply, fill #0

## 2022-01-14 NOTE — Progress Notes (Signed)
No egg or soy allergy known to patient  No issues known to pt with past sedation with any surgeries or procedures Patient denies ever being told they had issues or difficulty with intubation  No FH of Malignant Hyperthermia Pt is not on diet pills Pt is not on  home 02  Pt is not on blood thinners  Pt denies issues with constipation  No A fib or A flutter Have any cardiac testing pending--denied Pt instructed to use [email or web address] or GoodRx for a price reduction on prep   

## 2022-01-18 ENCOUNTER — Other Ambulatory Visit (HOSPITAL_COMMUNITY): Payer: Self-pay

## 2022-01-19 ENCOUNTER — Encounter: Payer: Self-pay | Admitting: Gastroenterology

## 2022-01-24 ENCOUNTER — Other Ambulatory Visit (HOSPITAL_COMMUNITY): Payer: Self-pay

## 2022-02-03 ENCOUNTER — Encounter: Payer: Self-pay | Admitting: Gastroenterology

## 2022-02-03 ENCOUNTER — Ambulatory Visit (AMBULATORY_SURGERY_CENTER): Payer: 59 | Admitting: Gastroenterology

## 2022-02-03 VITALS — BP 93/62 | HR 60 | Temp 98.2°F | Resp 14 | Ht 72.0 in | Wt 192.0 lb

## 2022-02-03 DIAGNOSIS — Z1211 Encounter for screening for malignant neoplasm of colon: Secondary | ICD-10-CM

## 2022-02-03 DIAGNOSIS — D125 Benign neoplasm of sigmoid colon: Secondary | ICD-10-CM

## 2022-02-03 MED ORDER — SODIUM CHLORIDE 0.9 % IV SOLN: 500.0000 mL | Freq: Once | INTRAVENOUS | Status: DC

## 2022-02-03 NOTE — Progress Notes (Signed)
A and O x3. Report to RN. Tolerated MAC anesthesia well. 

## 2022-02-03 NOTE — Progress Notes (Signed)
History & Physical  Primary Care Physician:  Brian Pal, DO Primary Gastroenterologist: Lucio Edward, MD  CHIEF COMPLAINT:  CRC screening  HPI: Brian Lynch is a 61 y.o. male for CRC screening, average risk, with colonoscopy.   Past Medical History:  Diagnosis Date   Foot pain    Podiatrist: sesamoiditis and pes planus, enthesopathy of ankle/foot, osteoarthritis ankle/foot    Past Surgical History:  Procedure Laterality Date   COLONOSCOPY  08/03/10   Normal (small internal hemorrhoids).  Repeat in 10 yrs.    Prior to Admission medications   Medication Sig Start Date End Date Taking? Authorizing Provider  aspirin EC 81 MG tablet Take 81 mg by mouth daily. Swallow whole.   Yes [provider]  Multiple Vitamin (MULTIVITAMIN) tablet Take 1 tablet by mouth daily.   Yes [provider]  Omega-3 Fatty Acids (FISH OIL) 1000 MG CAPS Take 1 capsule by mouth daily.   Yes [provider]  Probiotic Product (PROBIOTIC DAILY PO) Take by mouth.   Yes [provider]    Current Outpatient Medications  Medication Sig Dispense Refill   aspirin EC 81 MG tablet Take 81 mg by mouth daily. Swallow whole.     Multiple Vitamin (MULTIVITAMIN) tablet Take 1 tablet by mouth daily.     Omega-3 Fatty Acids (FISH OIL) 1000 MG CAPS Take 1 capsule by mouth daily.     Probiotic Product (PROBIOTIC DAILY PO) Take by mouth.     Current Facility-Administered Medications  Medication Dose Route Frequency Provider Last Rate Last Admin   0.9 %  sodium chloride infusion  500 mL Intravenous Once Ladene Artist, MD        Allergies as of 02/03/2022   (No Known Allergies)    Family History  Problem Relation Age of Onset   Atrial fibrillation Mother    Heart failure Mother    COPD Mother    Glaucoma Mother    Colon polyps Father    Stroke Father    Aneurysm Father        AAA   Colon cancer Neg Hx    Esophageal cancer Neg Hx    Rectal cancer Neg  Hx    Stomach cancer Neg Hx     Social History   Socioeconomic History   Marital status: Married    Spouse name: [name]   Number of children: 3   Years of education: Not on file   Highest education level: Not on file  Occupational History   Occupation: Probation officer  Tobacco Use   Smoking status: Never   Smokeless tobacco: Never  Vaping Use   Vaping Use: Never used  Substance and Sexual Activity   Alcohol use: Yes    Alcohol/week: 5.0 standard drinks of alcohol    Types: 5 Standard drinks or equivalent per week   Drug use: No   Sexual activity: Not on file  Other Topics Concern   Not on file  Social History Narrative   Married, 3 children.   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   No Tob, alc, or drugs.   Social Determinants of Health   Financial Resource Strain: Not on file  Food Insecurity: Not on file  Transportation Needs: Not on file  Physical Activity: Not on file  Stress: Not on file  Social Connections: Not on file  Intimate Partner Violence: Not on file    Review of Systems:  All systems reviewed were negative except where noted  in HPI.   Physical Exam: General:  Alert, well-developed, in NAD Head:  Normocephalic and atraumatic. Eyes:  Sclera clear, no icterus.   Conjunctiva pink. Ears:  Normal auditory acuity. Mouth:  No deformity or lesions.  Neck:  Supple; no masses . Lungs:  Clear throughout to auscultation.   No wheezes, crackles, or rhonchi. No acute distress. Heart:  Regular rate and rhythm; no murmurs. Abdomen:  Soft, nondistended, nontender. No masses, hepatomegaly. No obvious masses.  Normal bowel .    Rectal:  Deferred   Msk:  Symmetrical without gross deformities.. Pulses:  Normal pulses noted. Extremities:  Without edema. Neurologic:  Alert and  oriented x4;  grossly normal neurologically. Skin:  Intact without significant lesions or rashes. Cervical Nodes:  No significant cervical adenopathy. Psych:  Alert and cooperative.  Normal mood and affect.   Impression / Plan:   CRC screening, average risk, for colonoscopy.  Pricilla Riffle. Fuller Plan  02/03/2022, 7:56 AM See Shea Evans, [facility], to contact our on call provider

## 2022-02-03 NOTE — Progress Notes (Signed)
VS completed by DT.  Pt's states no medical or surgical changes since previsit or office visit.  

## 2022-02-03 NOTE — Progress Notes (Signed)
Called to room to assist during endoscopic procedure.  Patient ID and intended procedure confirmed with present staff. Received instructions for my participation in the procedure from the performing physician.  

## 2022-02-03 NOTE — Op Note (Signed)
[facility] Patient Name: Brian Lynch Procedure Date: 02/03/2022 7:59 AM MRN: [id number] Endoscopist: Ladene Artist , MD Age: 61 Referring MD:  Date of Birth: 1960/12/27 Gender: Male Account #: [id number] Procedure:                Colonoscopy Indications:              Screening for colorectal malignant neoplasm Medicines:                Monitored Anesthesia Care Procedure:                Pre-Anesthesia Assessment:                           - Prior to the procedure, a History and Physical                            was performed, and patient medications and                            allergies were reviewed. The patient's tolerance of                            previous anesthesia was also reviewed. The risks                            and benefits of the procedure and the sedation                            options and risks were discussed with the patient.                            All questions were answered, and informed consent                            was obtained. Prior Anticoagulants: The patient has                            taken no previous anticoagulant or antiplatelet                            agents. ASA Grade Assessment: II - A patient with                            mild systemic disease. After reviewing the risks                            and benefits, the patient was deemed in                            satisfactory condition to undergo the procedure.                           After obtaining informed consent, the colonoscope  was passed under direct vision. Throughout the                            procedure, the patient's blood pressure, pulse, and                            oxygen saturations were monitored continuously. The                            CF [id number] #[id number] was introduced through the anus                            and advanced to the the cecum, identified by                            appendiceal orifice  and ileocecal valve. The                            ileocecal valve, appendiceal orifice, and rectum                            were photographed. The quality of the bowel                            preparation was good. The colonoscopy was performed                            without difficulty. The patient tolerated the                            procedure well. Scope In: 8:03:58 AM Scope Out: 8:21:37 AM Scope Withdrawal Time: 0 hours 13 minutes 4 seconds  Total Procedure Duration: 0 hours 17 minutes 39 seconds  Findings:                 The perianal and digital rectal examinations were                            normal.                           A 8 mm polyp was found in the sigmoid colon. The                            polyp was semi-pedunculated. The polyp was removed                            with a cold snare. Resection and retrieval were                            complete.                           A few small-mouthed diverticula were found in the  left colon. There was no evidence of diverticular                            bleeding.                           Internal hemorrhoids were found during                            retroflexion. The hemorrhoids were small and Grade                            I (internal hemorrhoids that do not prolapse).                           The exam was otherwise without abnormality on                            direct and retroflexion views. Complications:            No immediate complications. Estimated blood loss:                            None. Estimated Blood Loss:     Estimated blood loss: none. Impression:               - One 8 mm polyp in the sigmoid colon, removed with                            a cold snare. Resected and retrieved.                           - Mild diverticulosis in the left colon.                           - Internal hemorrhoids.                           - The examination was otherwise  normal on direct                            and retroflexion views. Recommendation:           - Repeat colonoscopy after studies are complete for                            surveillance based on pathology results.                           - Patient has a contact number available for                            emergencies. The signs and symptoms of potential                            delayed complications were discussed with the  patient. Return to normal activities tomorrow.                            Written discharge instructions were provided to the                            patient.                           - Resume previous diet.                           - Continue present medications.                           - Await pathology results. Ladene Artist, MD 02/03/2022 8:25:09 AM This report has been signed electronically.

## 2022-02-03 NOTE — Patient Instructions (Signed)
Information on polyps and diverticulosis given to you today.  Await pathology results.  Resume previous diet and medications.   YOU HAD AN ENDOSCOPIC PROCEDURE TODAY AT THE [facility]:   Refer to the procedure report that was given to you for any specific questions about what was found during the examination.  If the procedure report does not answer your questions, please call your gastroenterologist to clarify.  If you requested that your care partner not be given the details of your procedure findings, then the procedure report has been included in a sealed envelope for you to review at your convenience later.  YOU SHOULD EXPECT: Some feelings of bloating in the abdomen. Passage of more gas than usual.  Walking can help get rid of the air that was put into your GI tract during the procedure and reduce the bloating. If you had a lower endoscopy (such as a colonoscopy or flexible sigmoidoscopy) you may notice spotting of blood in your stool or on the toilet paper. If you underwent a bowel prep for your procedure, you may not have a normal bowel movement for a few days.  Please Note:  You might notice some irritation and congestion in your nose or some drainage.  This is from the oxygen used during your procedure.  There is no need for concern and it should clear up in a day or so.  SYMPTOMS TO REPORT IMMEDIATELY:  Following lower endoscopy (colonoscopy or flexible sigmoidoscopy):  Excessive amounts of blood in the stool  Significant tenderness or worsening of abdominal pains  Swelling of the abdomen that is new, acute  Fever of 100F or higher  For urgent or emergent issues, a gastroenterologist can be reached at any hour by calling [phone]. Do not use MyChart messaging for urgent concerns.    DIET:  We do recommend a small meal at first, but then you may proceed to your regular diet.  Drink plenty of fluids but you should avoid alcoholic beverages for 24  hours.  ACTIVITY:  You should plan to take it easy for the rest of today and you should NOT DRIVE or use heavy machinery until tomorrow (because of the sedation medicines used during the test).    FOLLOW UP: Our staff will call the number listed on your records the next business day following your procedure.  We will call around 7:15- 8:00 am to check on you and address any questions or concerns that you may have regarding the information given to you following your procedure. If we do not reach you, we will leave a message.     If any biopsies were taken you will be contacted by phone or by letter within the next 1-3 weeks.  Please call us at [phone] if you have not heard about the biopsies in 3 weeks.    SIGNATURES/CONFIDENTIALITY: You and/or your care partner have signed paperwork which will be entered into your electronic medical record.  These signatures attest to the fact that that the information above on your After Visit Summary has been reviewed and is understood.  Full responsibility of the confidentiality of this discharge information lies with you and/or your care-partner. 

## 2022-02-04 ENCOUNTER — Telehealth: Payer: Self-pay

## 2022-02-04 NOTE — Telephone Encounter (Signed)
  Follow up Call-     02/03/2022    7:19 AM  Call back number  Post procedure Call Back phone  # [phone]  Permission to leave phone message Yes     Patient questions:  Do you have a fever, pain , or abdominal swelling? No. Pain Score  0 *  Have you tolerated food without any problems? Yes.    Have you been able to return to your normal activities? Yes.    Do you have any questions about your discharge instructions: Diet   No. Medications  No. Follow up visit  No.  Do you have questions or concerns about your Care? No.  Actions: * If pain score is 4 or above: No action needed, pain <4.

## 2022-02-09 ENCOUNTER — Encounter: Payer: Self-pay | Admitting: Gastroenterology

## 2022-10-26 NOTE — Progress Notes (Signed)
[name] [address] [place] [address] Phone: [phone] Subjective:   Brian Lynch, am serving as a scribe for Dr. Antoine Primas.  I'm seeing this patient by the request  of:  [name], DO  CC: right hip pain   [id number]  Last seen 2018 for finger dislocation  Brian Lynch is a 62 y.o. male coming in with complaint of R hip pain. Patient states that he has had intermittent pain over R GT. Mornings are worse as is sitting for prolonged periods. Has some numbness in R quad. No back pain.      Past Medical History:  Diagnosis Date   Foot pain    Podiatrist: sesamoiditis and pes planus, enthesopathy of ankle/foot, osteoarthritis ankle/foot   Past Surgical History:  Procedure Laterality Date   COLONOSCOPY  08/03/10   Normal (small internal hemorrhoids).  Repeat in 10 yrs.   Social History   Socioeconomic History   Marital status: Married    Spouse name: Misty Stanley   Number of children: 3   Years of education: Not on file   Highest education level: Not on file  Occupational History   Occupation: Clinical research associate  Tobacco Use   Smoking status: Never   Smokeless tobacco: Never  Vaping Use   Vaping Use: Never used  Substance and Sexual Activity   Alcohol use: Yes    Alcohol/week: 5.0 standard drinks of alcohol    Types: 5 Standard drinks or equivalent per week   Drug use: No   Sexual activity: Not on file  Other Topics Concern   Not on file  Social History Narrative   Married, 3 children.   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   No Tob, alc, or drugs.   Social Determinants of Health   Financial Resource Strain: Not on file  Food Insecurity: Not on file  Transportation Needs: Not on file  Physical Activity: Not on file  Stress: Not on file  Social Connections: Not on file   No Known Allergies Family History  Problem Relation Age of Onset   Atrial fibrillation Mother    Heart failure Mother     COPD Mother    Glaucoma Mother    Colon polyps Father    Stroke Father    Aneurysm Father        AAA   Colon cancer Neg Hx    Esophageal cancer Neg Hx    Rectal cancer Neg Hx    Stomach cancer Neg Hx        Current Outpatient Medications (Analgesics):    aspirin EC 81 MG tablet, Take 81 mg by mouth daily. Swallow whole.   meloxicam (MOBIC) 15 MG tablet, Take 1 tablet (15 mg total) by mouth daily.   Current Outpatient Medications (Other):    Multiple Vitamin (MULTIVITAMIN) tablet, Take 1 tablet by mouth daily.   Omega-3 Fatty Acids (FISH OIL) 1000 MG CAPS, Take 1 capsule by mouth daily.   Probiotic Product (PROBIOTIC DAILY PO), Take by mouth.   Reviewed prior external information including notes and imaging from  primary care provider As well as notes that were available from care everywhere and other healthcare systems.  Past medical history, social, surgical and family history all reviewed in electronic medical record.  No pertanent information unless stated regarding to the chief complaint.   Review of Systems:  No headache, visual changes, nausea, vomiting, diarrhea, constipation, dizziness, abdominal pain, skin rash, fevers, chills, night sweats,  weight loss, swollen lymph nodes, body aches, joint swelling, chest pain, shortness of breath, mood changes. POSITIVE muscle aches  Objective  Pulse 86, height 6' (1.829 m), weight 189 lb (85.7 kg), SpO2 98 %.   General: No apparent distress alert and oriented x3 mood and affect normal, dressed appropriately.  HEENT: Pupils equal, extraocular movements intact  Respiratory: Patient's speak in full sentences and does not appear short of breath  Cardiovascular: No lower extremity edema, non tender, no erythema  Right hip exam shows tenderness to palpation and appears the musculature little bit more over the gluteal area.  Patient does have significant tightness noted with Pearlean Brownie negative straight leg test noted.   Osteopathic  findings T5 extended rotated and side bent right inhaled third rib T8 extended rotated and side bent left L1 flexed rotated and side bent right L3 flexed rotated and side bent left Sacrum right on right      Impression and Recommendations:     The above documentation has been reviewed and is accurate and complete [name], DO

## 2022-10-31 ENCOUNTER — Ambulatory Visit (INDEPENDENT_AMBULATORY_CARE_PROVIDER_SITE_OTHER): Payer: Commercial Managed Care - PPO

## 2022-10-31 ENCOUNTER — Encounter: Payer: Self-pay | Admitting: Family Medicine

## 2022-10-31 ENCOUNTER — Other Ambulatory Visit (HOSPITAL_COMMUNITY): Payer: Self-pay

## 2022-10-31 ENCOUNTER — Ambulatory Visit (INDEPENDENT_AMBULATORY_CARE_PROVIDER_SITE_OTHER): Payer: Commercial Managed Care - PPO | Admitting: Family Medicine

## 2022-10-31 ENCOUNTER — Other Ambulatory Visit: Payer: Self-pay

## 2022-10-31 VITALS — HR 86 | Ht 72.0 in | Wt 189.0 lb

## 2022-10-31 DIAGNOSIS — M9903 Segmental and somatic dysfunction of lumbar region: Secondary | ICD-10-CM | POA: Diagnosis not present

## 2022-10-31 DIAGNOSIS — M25551 Pain in right hip: Secondary | ICD-10-CM | POA: Diagnosis not present

## 2022-10-31 DIAGNOSIS — M9904 Segmental and somatic dysfunction of sacral region: Secondary | ICD-10-CM

## 2022-10-31 DIAGNOSIS — M9902 Segmental and somatic dysfunction of thoracic region: Secondary | ICD-10-CM | POA: Diagnosis not present

## 2022-10-31 DIAGNOSIS — G8929 Other chronic pain: Secondary | ICD-10-CM | POA: Diagnosis not present

## 2022-10-31 MED ORDER — MELOXICAM 15 MG PO TABS
15.0000 mg | ORAL_TABLET | Freq: Every day | ORAL | 0 refills | Status: AC
  Filled 2022-10-31: qty 30, 30d supply, fill #0

## 2022-10-31 NOTE — Assessment & Plan Note (Signed)
With patient's x-rays being relatively unremarkable patient does have some degenerative disc of the lumbar spine that we will need to monitor.  Did respond extremely well to osteopathic manipulation.  Discussed core strengthening and hip abductor strengthening.  Discussed which activities to do and which ones to avoid.  Increase activity slowly.  Will follow-up again in 6 to 8 weeks.

## 2022-10-31 NOTE — Patient Instructions (Addendum)
Hip abduction  Meloxicam 15mg  daily for 5 day bursts See me again in 6 weeks

## 2022-12-14 NOTE — Progress Notes (Signed)
  [name] [address] [place] [address] Phone: [phone] Subjective:   [name], am serving as a scribe for Dr. Antoine Primas.  I'm seeing this patient by the request  of:  [name], DO  CC: Back and neck pain follow-up  [id number]  Brian Lynch is a 62 y.o. male coming in with complaint of back and neck pain. OMT 10/31/2022. Also f/u for R hip pain. Patient states doing better since last appointment. No new concerns. Here for manipulation.          Reviewed prior external information including notes and imaging from previsou exam, outside providers and external EMR if available.   As well as notes that were available from care everywhere and other healthcare systems.  Past medical history, social, surgical and family history all reviewed in electronic medical record.  No pertanent information unless stated regarding to the chief complaint.   Past Medical History:  Diagnosis Date   Foot pain    Podiatrist: sesamoiditis and pes planus, enthesopathy of ankle/foot, osteoarthritis ankle/foot    No Known Allergies   Review of Systems:  No headache, visual changes, nausea, vomiting, diarrhea, constipation, dizziness, abdominal pain, skin rash, fevers, chills, night sweats, weight loss, swollen lymph nodes, body aches, joint swelling, chest pain, shortness of breath, mood changes. POSITIVE muscle aches  Objective  Blood pressure 110/72, pulse 67, height 6' (1.829 m), weight 189 lb (85.7 kg), SpO2 98%.   General: No apparent distress alert and oriented x3 mood and affect normal, dressed appropriately.  HEENT: Pupils equal, extraocular movements intact  Respiratory: Patient's speak in full sentences and does not appear short of breath  Cardiovascular: No lower extremity edema, non tender, no erythema  Low back seems to be mostly in the right area.  Patient does have some tenderness to palpation in the right  sacroiliac joint.  Mild tightness with Pearlean Brownie compared to the contralateral side.  Osteopathic findings  C3 flexed rotated and side bent right C6 flexed rotated and side bent left T7 extended rotated and side bent left L2 flexed rotated and side bent right L4 flexed rotated and side bent right Sacrum right on right       Assessment and Plan:  Right hip pain has been responding extremely to osteopathic manipulation at this time.   strengthening.  Given some home exercises that I think will also be helpful.  Follow-up with me again in 2 to 3 months.  Discussed core strengthening as well as given home exercises that I think will be beneficial.    Nonallopathic problems  Decision today to treat with OMT was based on Physical Exam  After verbal consent patient was treated with HVLA, ME, FPR techniques in cervical, rib, thoracic, lumbar, and sacral  areas  Patient tolerated the procedure well with improvement in symptoms  Patient given exercises, stretches and lifestyle modifications  See medications in patient instructions if given  Patient will follow up in 4-8 weeks     The above documentation has been reviewed and is accurate and complete [name], DO         Note: This dictation was prepared with Dragon dictation along with smaller phrase technology. Any transcriptional errors that result from this process are unintentional.

## 2022-12-15 ENCOUNTER — Encounter (INDEPENDENT_AMBULATORY_CARE_PROVIDER_SITE_OTHER): Payer: Self-pay

## 2022-12-15 ENCOUNTER — Other Ambulatory Visit: Payer: Self-pay | Admitting: Family Medicine

## 2022-12-15 ENCOUNTER — Other Ambulatory Visit (HOSPITAL_COMMUNITY): Payer: Self-pay

## 2022-12-15 DIAGNOSIS — J029 Acute pharyngitis, unspecified: Secondary | ICD-10-CM

## 2022-12-15 MED ORDER — PREDNISONE 10 MG PO TABS
ORAL_TABLET | ORAL | 0 refills | Status: AC
  Filled 2022-12-15: qty 20, 12d supply, fill #0

## 2022-12-20 ENCOUNTER — Encounter: Payer: Self-pay | Admitting: Family Medicine

## 2022-12-20 ENCOUNTER — Ambulatory Visit: Payer: Commercial Managed Care - PPO | Admitting: Family Medicine

## 2022-12-20 VITALS — BP 110/72 | HR 67 | Ht 72.0 in | Wt 189.0 lb

## 2022-12-20 DIAGNOSIS — M9902 Segmental and somatic dysfunction of thoracic region: Secondary | ICD-10-CM | POA: Diagnosis not present

## 2022-12-20 DIAGNOSIS — M9903 Segmental and somatic dysfunction of lumbar region: Secondary | ICD-10-CM | POA: Diagnosis not present

## 2022-12-20 DIAGNOSIS — M9901 Segmental and somatic dysfunction of cervical region: Secondary | ICD-10-CM

## 2022-12-20 DIAGNOSIS — M25551 Pain in right hip: Secondary | ICD-10-CM | POA: Diagnosis not present

## 2022-12-20 DIAGNOSIS — M9904 Segmental and somatic dysfunction of sacral region: Secondary | ICD-10-CM

## 2022-12-20 NOTE — Assessment & Plan Note (Signed)
has been responding extremely to osteopathic manipulation at this time.   strengthening.  Given some home exercises that I think will also be helpful.  Follow-up with me again in 2 to 3 months.  Discussed core strengthening as well as given home exercises that I think will be beneficial.

## 2022-12-20 NOTE — Patient Instructions (Signed)
Good to see you! Overall significant improvement already See you again in 2-3 months

## 2023-01-23 ENCOUNTER — Encounter: Payer: Self-pay | Admitting: Family Medicine

## 2023-01-23 ENCOUNTER — Ambulatory Visit (INDEPENDENT_AMBULATORY_CARE_PROVIDER_SITE_OTHER): Payer: Commercial Managed Care - PPO | Admitting: Family Medicine

## 2023-01-23 ENCOUNTER — Other Ambulatory Visit (HOSPITAL_COMMUNITY): Payer: Self-pay

## 2023-01-23 VITALS — BP 108/70 | HR 56 | Temp 98.0°F | Ht 72.0 in | Wt 188.2 lb

## 2023-01-23 DIAGNOSIS — N401 Enlarged prostate with lower urinary tract symptoms: Secondary | ICD-10-CM

## 2023-01-23 DIAGNOSIS — Z125 Encounter for screening for malignant neoplasm of prostate: Secondary | ICD-10-CM | POA: Diagnosis not present

## 2023-01-23 DIAGNOSIS — R0683 Snoring: Secondary | ICD-10-CM

## 2023-01-23 DIAGNOSIS — R351 Nocturia: Secondary | ICD-10-CM

## 2023-01-23 DIAGNOSIS — Z0000 Encounter for general adult medical examination without abnormal findings: Secondary | ICD-10-CM

## 2023-01-23 LAB — LIPID PANEL
Cholesterol: 181 mg/dL (ref 0–200)
HDL: 47.8 mg/dL (ref >39.00)
LDL Cholesterol: 116 mg/dL — ABNORMAL HIGH (ref 0–99)
NonHDL: 132.71
Total CHOL/HDL Ratio: 4
Triglycerides: 85 mg/dL (ref 0.0–149.0)
VLDL: 17 mg/dL (ref 0.0–40.0)

## 2023-01-23 LAB — CBC
HCT: 43.1 % (ref 39.0–52.0)
Hemoglobin: 14.2 g/dL (ref 13.0–17.0)
MCHC: 33 g/dL (ref 30.0–36.0)
MCV: 91 fl (ref 78.0–100.0)
Platelets: 307 10*3/uL (ref 150.0–400.0)
RBC: 4.74 Mil/uL (ref 4.22–5.81)
RDW: 13.4 % (ref 11.5–15.5)
WBC: 9.2 10*3/uL (ref 4.0–10.5)

## 2023-01-23 LAB — COMPREHENSIVE METABOLIC PANEL
ALT: 15 U/L (ref 0–53)
AST: 17 U/L (ref 0–37)
Albumin: 3.8 g/dL (ref 3.5–5.2)
Alkaline Phosphatase: 63 U/L (ref 39–117)
BUN: 23 mg/dL (ref 6–23)
CO2: 28 mEq/L (ref 19–32)
Calcium: 8.8 mg/dL (ref 8.4–10.5)
Chloride: 105 mEq/L (ref 96–112)
Creatinine, Ser: 0.91 mg/dL (ref 0.40–1.50)
GFR: 89.84 mL/min (ref >60.00)
Glucose, Bld: 89 mg/dL (ref 70–99)
Potassium: 4.4 mEq/L (ref 3.5–5.1)
Sodium: 139 mEq/L (ref 135–145)
Total Bilirubin: 1.5 mg/dL — ABNORMAL HIGH (ref 0.2–1.2)
Total Protein: 6.3 g/dL (ref 6.0–8.3)

## 2023-01-23 LAB — PSA: PSA: 0.45 ng/mL (ref 0.10–4.00)

## 2023-01-23 MED ORDER — TAMSULOSIN HCL 0.4 MG PO CAPS
0.4000 mg | ORAL_CAPSULE | Freq: Every day | ORAL | 3 refills | Status: AC
  Filled 2023-01-23: qty 30, 30d supply, fill #0

## 2023-01-23 NOTE — Patient Instructions (Addendum)
Give [place] 2-3 business days to get the results of your labs back.   Keep the diet clean and stay active.  Please consider adding some weight resistance exercise to your routine. Consider yoga as well.   Please get me a copy of your advanced directive form at your convenience.   If you do not hear anything about your referral in the next 1-2 weeks, call our office and ask for an update.  Let us know if you need anything.

## 2023-01-23 NOTE — Progress Notes (Signed)
Chief Complaint  Patient presents with   Annual Exam    Urinating more frequently at night     Well Male Brian Lynch is here for a complete physical.   His last physical was >1 year ago.  Current diet: in general, a "healthy" diet.  Current exercise: playing basketball, strength  Weight trend: stable Fatigue out of ordinary? No. Seat belt? Yes.   Advanced directive? Yes  Health maintenance Shingrix- Yes Colonoscopy- Yes Tetanus- Yes HIV- Yes Hep C- Yes  BPH Over the past year, pt has been urinating more freq at night only. Waking up 3-4 times per night to urinate. Steadily worsening.  He is cut out nighttime tea which helped a little bit.  He does not drink copious months of fluids.  Use alcohol 3-4 times per week which does not correlate with his frequency.  He is not having any discharge, bleeding, pain, constipation, abdominal pain.  He drinks coffee in the morning.  He has never been on any medication.  Patient snores.  He is requesting a sleep study.  No daytime fatigue or witnessed apneic episodes   Past Medical History:  Diagnosis Date   Foot pain    Podiatrist: sesamoiditis and pes planus, enthesopathy of ankle/foot, osteoarthritis ankle/foot     Past Surgical History:  Procedure Laterality Date   COLONOSCOPY  08/03/10   Normal (small internal hemorrhoids).  Repeat in 10 yrs.    Medications  Current Outpatient Medications on File Prior to Visit  Medication Sig Dispense Refill   aspirin EC 81 MG tablet Take 81 mg by mouth daily. Swallow whole.     meloxicam (MOBIC) 15 MG tablet Take 1 tablet (15 mg total) by mouth daily. 30 tablet 0   Multiple Vitamin (MULTIVITAMIN) tablet Take 1 tablet by mouth daily.     Omega-3 Fatty Acids (FISH OIL) 1000 MG CAPS Take 1 capsule by mouth daily.     Probiotic Product (PROBIOTIC DAILY PO) Take by mouth.      Allergies No Known Allergies  Family History Family History  Problem Relation Age of Onset   Atrial  fibrillation Mother    Heart failure Mother    COPD Mother    Glaucoma Mother    Colon polyps Father    Stroke Father    Aneurysm Father        AAA   Colon cancer Neg Hx    Esophageal cancer Neg Hx    Rectal cancer Neg Hx    Stomach cancer Neg Hx     Review of Systems: Constitutional:  no fevers Eye:  no recent significant change in vision Ear/Nose/Mouth/Throat:  Ears:  no hearing loss Nose/Mouth/Throat:  no complaints of nasal congestion, no sore throat Cardiovascular:  no chest pain Respiratory:  no shortness of breath Gastrointestinal:  no change in bowel habits GU:  Male: negative for dysuria, + nighttime frequency Musculoskeletal/Extremities:  no joint pain Integumentary (Skin/Breast):  no abnormal skin lesions reported Neurologic:  no headaches Endocrine: No unexpected weight changes Hematologic/Lymphatic:  no abnormal bleeding  Exam BP 108/70 (BP Location: Left Arm, Patient Position: Sitting, Cuff Size: Normal)   Pulse (!) 56   Temp 98 F (36.7 C) (Oral)   Ht 6' (1.829 m)   Wt 188 lb 4 oz (85.4 kg)   SpO2 97%   BMI 25.53 kg/m  General:  well developed, well nourished, in no apparent distress Skin:  no significant moles, warts, or growths Head:  no masses, lesions, or tenderness  Eyes:  pupils equal and round, sclera anicteric without injection Ears:  canals without lesions, TMs shiny without retraction, no obvious effusion, no erythema Nose:  nares patent, mucosa normal Throat/Pharynx:  lips and gingiva without lesion; tongue and uvula midline; non-inflamed pharynx; no exudates or postnasal drainage Neck: neck supple without adenopathy, thyromegaly, or masses Cardiac: reg rhythm, bradycardic, no bruits, no LE edema Lungs:  clear to auscultation, breath sounds equal bilaterally, no respiratory distress Abdomen: BS+, soft, non-tender, non-distended, no masses or organomegaly noted Rectal: Deferred Musculoskeletal:  symmetrical muscle groups noted without atrophy  or deformity Neuro:  gait normal; deep tendon reflexes normal and symmetric Psych: well oriented with normal range of affect and appropriate judgment/insight  Assessment and Plan  Well adult exam - Plan: CBC, Comprehensive metabolic panel, Lipid panel  Screening for prostate cancer - Plan: PSA  Benign prostatic hyperplasia with nocturia  Snoring - Plan: Ambulatory referral to Neurology   Well 62 y.o. male. Counseled on diet and exercise. Counseled on risks and benefits of prostate cancer screening with PSA. The patient agrees to undergo testing. BPH: Chronic, uncontrolled. Start Flomax 0.4 mg qhs. No fluids within 90 min of planned bedtime.  Snoring: Refer to neuro for possible sleep study.  Advanced directive form requested today.  Immunizations, labs, and further orders as above. Follow up in 1 yr. The patient voiced understanding and agreement to the plan.  Jilda Roche [place], DO 01/23/23 8:30 AM

## 2023-02-01 ENCOUNTER — Other Ambulatory Visit (HOSPITAL_COMMUNITY): Payer: Self-pay

## 2023-02-17 NOTE — Progress Notes (Deleted)
  [name] [address] [place] [address] Phone: [phone] Subjective:    I'm seeing this patient by the request  of:  [name], DO  CC:   [id number]  Brian Lynch is a 62 y.o. male coming in with complaint of back and neck pain. OMT 12/20/2022. Patient states   Medications patient has been prescribed: Meloxicam  Taking:         Reviewed prior external information including notes and imaging from previsou exam, outside providers and external EMR if available.   As well as notes that were available from care everywhere and other healthcare systems.  Past medical history, social, surgical and family history all reviewed in electronic medical record.  No pertanent information unless stated regarding to the chief complaint.   Past Medical History:  Diagnosis Date   Foot pain    Podiatrist: sesamoiditis and pes planus, enthesopathy of ankle/foot, osteoarthritis ankle/foot    No Known Allergies   Review of Systems:  No headache, visual changes, nausea, vomiting, diarrhea, constipation, dizziness, abdominal pain, skin rash, fevers, chills, night sweats, weight loss, swollen lymph nodes, body aches, joint swelling, chest pain, shortness of breath, mood changes. POSITIVE muscle aches  Objective  There were no vitals taken for this visit.   General: No apparent distress alert and oriented x3 mood and affect normal, dressed appropriately.  HEENT: Pupils equal, extraocular movements intact  Respiratory: Patient's speak in full sentences and does not appear short of breath  Cardiovascular: No lower extremity edema, non tender, no erythema  Gait MSK:  Back   Osteopathic findings  C2 flexed rotated and side bent right C6 flexed rotated and side bent left T3 extended rotated and side bent right inhaled rib T9 extended rotated and side bent left L2 flexed rotated and side bent right Sacrum right on right        Assessment and Plan:  No problem-specific Assessment & Plan notes found for this encounter.    Nonallopathic problems  Decision today to treat with OMT was based on Physical Exam  After verbal consent patient was treated with HVLA, ME, FPR techniques in cervical, rib, thoracic, lumbar, and sacral  areas  Patient tolerated the procedure well with improvement in symptoms  Patient given exercises, stretches and lifestyle modifications  See medications in patient instructions if given  Patient will follow up in 4-8 weeks             Note: This dictation was prepared with Dragon dictation along with smaller phrase technology. Any transcriptional errors that result from this process are unintentional.

## 2023-02-21 ENCOUNTER — Encounter: Payer: Self-pay | Admitting: Family Medicine

## 2023-02-21 ENCOUNTER — Institutional Professional Consult (permissible substitution): Payer: Commercial Managed Care - PPO | Admitting: Neurology

## 2023-02-21 ENCOUNTER — Ambulatory Visit: Payer: Commercial Managed Care - PPO | Admitting: Family Medicine

## 2023-02-21 VITALS — BP 103/64 | HR 75 | Ht 72.0 in | Wt 191.0 lb

## 2023-02-21 DIAGNOSIS — M9902 Segmental and somatic dysfunction of thoracic region: Secondary | ICD-10-CM | POA: Diagnosis not present

## 2023-02-21 DIAGNOSIS — M9908 Segmental and somatic dysfunction of rib cage: Secondary | ICD-10-CM | POA: Diagnosis not present

## 2023-02-21 DIAGNOSIS — M9901 Segmental and somatic dysfunction of cervical region: Secondary | ICD-10-CM | POA: Diagnosis not present

## 2023-02-21 DIAGNOSIS — M9903 Segmental and somatic dysfunction of lumbar region: Secondary | ICD-10-CM | POA: Diagnosis not present

## 2023-02-21 DIAGNOSIS — M9904 Segmental and somatic dysfunction of sacral region: Secondary | ICD-10-CM

## 2023-02-21 DIAGNOSIS — M25551 Pain in right hip: Secondary | ICD-10-CM | POA: Diagnosis not present

## 2023-02-21 NOTE — Progress Notes (Signed)
  [name] [address] [place] [address] Phone: [phone] Subjective:   Brian Lynch, am serving as a scribe for Dr. Antoine Primas.  I'm seeing this patient by the request  of:  [name], DO  CC: Back and neck pain follow-up  [id number]  DENARDO HIGGS is a 62 y.o. male coming in with complaint of back and neck pain, was found to have right hip pain.  Patient states that he is sore from traveling. Pain in R hip had improved prior to traveling.  Medications patient has been prescribed:   Taking:         Reviewed prior external information including notes and imaging from previsou exam, outside providers and external EMR if available.   As well as notes that were available from care everywhere and other healthcare systems.  Past medical history, social, surgical and family history all reviewed in electronic medical record.  No pertanent information unless stated regarding to the chief complaint.   Past Medical History:  Diagnosis Date   Foot pain    Podiatrist: sesamoiditis and pes planus, enthesopathy of ankle/foot, osteoarthritis ankle/foot    No Known Allergies   Review of Systems:  No headache, visual changes, nausea, vomiting, diarrhea, constipation, dizziness, abdominal pain, skin rash, fevers, chills, night sweats, weight loss, swollen lymph nodes, body aches, joint swelling, chest pain, shortness of breath, mood changes. POSITIVE muscle aches  Objective  Blood pressure 103/64, pulse 75, height 6' (1.829 m), weight 191 lb (86.6 kg), SpO2 96%.   General: No apparent distress alert and oriented x3 mood and affect normal, dressed appropriately.  HEENT: Pupils equal, extraocular movements intact  Respiratory: Patient's speak in full sentences and does not appear short of breath  Cardiovascular: No lower extremity edema, non tender, no erythema    Osteopathic findings  C3 flexed rotated and side bent  right T3 extended rotated and side bent right inhaled rib T6 extended rotated and side bent left L2 flexed rotated and side bent right L5 flexed rotated and side bent right Sacrum right on right    Assessment and Plan:  No problem-specific Assessment & Plan notes found for this encounter.    Nonallopathic problems  Decision today to treat with OMT was based on Physical Exam  After verbal consent patient was treated with HVLA, ME, FPR techniques in cervical, rib, thoracic, lumbar, and sacral  areas  Patient tolerated the procedure well with improvement in symptoms  Patient given exercises, stretches and lifestyle modifications  See medications in patient instructions if given  Patient will follow up in 4-8 weeks    The above documentation has been reviewed and is accurate and complete [name], DO          Note: This dictation was prepared with Dragon dictation along with smaller phrase technology. Any transcriptional errors that result from this process are unintentional.

## 2023-02-21 NOTE — Assessment & Plan Note (Signed)
Patient does have right hip pain.  Is responding extremely well though to osteopathic manipulation.  Exacerbation likely secondary to the traveling.  Discussed which activities to do and which ones to avoid.  Increase activity slowly.  Follow-up again in 6 to 8 weeks

## 2023-03-27 ENCOUNTER — Other Ambulatory Visit (HOSPITAL_COMMUNITY): Payer: Self-pay

## 2023-03-27 MED ORDER — COVID-19 MRNA VAC-TRIS(PFIZER) 30 MCG/0.3ML IM SUSY
0.3000 mL | PREFILLED_SYRINGE | Freq: Once | INTRAMUSCULAR | 0 refills | Status: AC
Start: 2023-03-27 — End: 2023-03-28
  Filled 2023-03-27: qty 0.3, 1d supply, fill #0

## 2023-03-27 MED ORDER — INFLUENZA VIRUS VACC SPLIT PF (FLUZONE) 0.5 ML IM SUSY
0.5000 mL | PREFILLED_SYRINGE | Freq: Once | INTRAMUSCULAR | 0 refills | Status: AC
  Filled 2023-03-27: qty 0.5, 1d supply, fill #0

## 2023-04-17 NOTE — Progress Notes (Signed)
  [name] [address] [place] [address] Phone: [phone] Subjective:   [name], am serving as a scribe for Dr. Antoine Primas.  I'm seeing this patient by the request  of:  [name], DO  CC: Back pain follow-up  [id number]  Brian Lynch is a 62 y.o. male coming in with complaint of back and neck pain. OMT 02/21/2023. Also f/u for R hip pain. Patient states same per usual. No new concerns.  Has been doing relatively well but has noticed increase his activity has more discomfort and pain.  Medications patient has been prescribed:   Taking:         Reviewed prior external information including notes and imaging from previsou exam, outside providers and external EMR if available.   As well as notes that were available from care everywhere and other healthcare systems.  Past medical history, social, surgical and family history all reviewed in electronic medical record.  No pertanent information unless stated regarding to the chief complaint.   Past Medical History:  Diagnosis Date   Foot pain    Podiatrist: sesamoiditis and pes planus, enthesopathy of ankle/foot, osteoarthritis ankle/foot    No Known Allergies   Review of Systems:  No headache, visual changes, nausea, vomiting, diarrhea, constipation, dizziness, abdominal pain, skin rash, fevers, chills, night sweats, weight loss, swollen lymph nodes, body aches, joint swelling, chest pain, shortness of breath, mood changes. POSITIVE muscle aches  Objective  Blood pressure 116/76, pulse 80, height 6' (1.829 m), weight 187 lb (84.8 kg), SpO2 96%.   General: No apparent distress alert and oriented x3 mood and affect normal, dressed appropriately.  HEENT: Pupils equal, extraocular movements intact  Respiratory: Patient's speak in full sentences and does not appear short of breath  Cardiovascular: No lower extremity edema, non tender, no erythema   Gait MSK:  Back does have loss lordosis noted.  Some tenderness to palpation of the paraspinal musculature.  Osteopathic findings  C2 flexed rotated and side bent right C6 flexed rotated and side bent left T5 extended rotated and side bent right inhaled rib T9 extended rotated and side bent left L2 flexed rotated and side bent right L4 flexed rotated sidebent right Sacrum right on right       Assessment and Plan:  Right hip pain Still seems to be consistent with more of the hip flexor than anything else at this time.  Discussed with patient about icing regimen and home exercises, continue short courses of anti-inflammatories when needed.  Discussed avoiding certain activities.  Still responding extremely well though to osteopathic manipulation.  Follow-up with me again in 6 to 8 weeks otherwise.    Nonallopathic problems  Decision today to treat with OMT was based on Physical Exam  After verbal consent patient was treated with HVLA, ME, FPR techniques in cervical, rib, thoracic, lumbar, and sacral  areas  Patient tolerated the procedure well with improvement in symptoms  Patient given exercises, stretches and lifestyle modifications  See medications in patient instructions if given  Patient will follow up in 4-8 weeks     The above documentation has been reviewed and is accurate and complete [name], DO         Note: This dictation was prepared with Dragon dictation along with smaller phrase technology. Any transcriptional errors that result from this process are unintentional.

## 2023-04-18 ENCOUNTER — Encounter: Payer: Self-pay | Admitting: Family Medicine

## 2023-04-18 ENCOUNTER — Ambulatory Visit: Payer: Commercial Managed Care - PPO | Admitting: Family Medicine

## 2023-04-18 VITALS — BP 116/76 | HR 80 | Ht 72.0 in | Wt 187.0 lb

## 2023-04-18 DIAGNOSIS — M25551 Pain in right hip: Secondary | ICD-10-CM

## 2023-04-18 DIAGNOSIS — M9908 Segmental and somatic dysfunction of rib cage: Secondary | ICD-10-CM

## 2023-04-18 DIAGNOSIS — M9902 Segmental and somatic dysfunction of thoracic region: Secondary | ICD-10-CM | POA: Diagnosis not present

## 2023-04-18 DIAGNOSIS — M9904 Segmental and somatic dysfunction of sacral region: Secondary | ICD-10-CM | POA: Diagnosis not present

## 2023-04-18 DIAGNOSIS — M9903 Segmental and somatic dysfunction of lumbar region: Secondary | ICD-10-CM | POA: Diagnosis not present

## 2023-04-18 DIAGNOSIS — M9901 Segmental and somatic dysfunction of cervical region: Secondary | ICD-10-CM | POA: Diagnosis not present

## 2023-04-18 NOTE — Assessment & Plan Note (Signed)
Still seems to be consistent with more of the hip flexor than anything else at this time.  Discussed with patient about icing regimen and home exercises, continue short courses of anti-inflammatories when needed.  Discussed avoiding certain activities.  Still responding extremely well though to osteopathic manipulation.  Follow-up with me again in 6 to 8 weeks otherwise.

## 2023-06-15 NOTE — Progress Notes (Unsigned)
  [name] [address] [place] [address] Phone: [phone] Subjective:   Brian Lynch, am serving as a scribe for Dr. Antoine Primas.  I'm seeing this patient by the request  of:  [name], DO  CC: back and neck pain follow up   [id number]  REGINA COPPOLINO is a 63 y.o. male coming in with complaint of back and neck pain. OMT 04/18/2023. Patient states some mild tightness but nothing that stopping him from activity.  Some tenderness to palpation he states feels like nothing that is stopping him from home exercises at this time.  Medications patient has been prescribed: Meloxicam  Taking: Yes         Reviewed prior external information including notes and imaging from previsou exam, outside providers and external EMR if available.   As well as notes that were available from care everywhere and other healthcare systems.  Past medical history, social, surgical and family history all reviewed in electronic medical record.  No pertanent information unless stated regarding to the chief complaint.   Past Medical History:  Diagnosis Date   Foot pain    Podiatrist: sesamoiditis and pes planus, enthesopathy of ankle/foot, osteoarthritis ankle/foot    No Known Allergies   Review of Systems:  No headache, visual changes, nausea, vomiting, diarrhea, constipation, dizziness, abdominal pain, skin rash, fevers, chills, night sweats, weight loss, swollen lymph nodes, body aches, joint swelling, chest pain, shortness of breath, mood changes. POSITIVE muscle aches  Objective  Blood pressure 108/78, pulse 72, height 6' (1.829 m), weight 192 lb (87.1 kg), SpO2 97%.   General: No apparent distress alert and oriented x3 mood and affect normal, dressed appropriately.  HEENT: Pupils equal, extraocular movements intact  Respiratory: Patient's speak in full sentences and does not appear short of breath  Cardiovascular: No lower  extremity edema, non tender, no erythema  Gait MSK:  Back does have some loss lordosis noted.  Some tenderness to palpation in the paraspinal musculature.  Tightness with Pearlean Brownie right greater than left.  Patient's neck does have some limited sidebending bilaterally.  Osteopathic findings  C2 flexed rotated and side bent right C6 flexed rotated and side bent left T3 extended rotated and side bent right inhaled rib T9 extended rotated and side bent left L1 flexed rotated and side bent right Sacrum right on right    Assessment and Plan:  Right hip pain Right hip pain noted tightness of hip flexor strength noted.  We discussed with patient that icing regimen and home exercises.  Responding well to the neck as well.  Discussed icing regimen.  Increase activity slowly.  Follow-up again in 6 to 8 weeks.    Nonallopathic problems  Decision today to treat with OMT was based on Physical Exam  After verbal consent patient was treated with HVLA, ME, FPR techniques in cervical, rib, thoracic, lumbar, and sacral  areas  Patient tolerated the procedure well with improvement in symptoms  Patient given exercises, stretches and lifestyle modifications  See medications in patient instructions if given  Patient will follow up in 4-8 weeks    The above documentation has been reviewed and is accurate and complete [name], DO          Note: This dictation was prepared with Dragon dictation along with smaller phrase technology. Any transcriptional errors that result from this process are unintentional.

## 2023-06-20 ENCOUNTER — Ambulatory Visit: Payer: Commercial Managed Care - PPO | Admitting: Family Medicine

## 2023-06-20 ENCOUNTER — Encounter: Payer: Self-pay | Admitting: Family Medicine

## 2023-06-20 VITALS — BP 108/78 | HR 72 | Ht 72.0 in | Wt 192.0 lb

## 2023-06-20 DIAGNOSIS — M25551 Pain in right hip: Secondary | ICD-10-CM | POA: Diagnosis not present

## 2023-06-20 DIAGNOSIS — M9902 Segmental and somatic dysfunction of thoracic region: Secondary | ICD-10-CM | POA: Diagnosis not present

## 2023-06-20 DIAGNOSIS — M9904 Segmental and somatic dysfunction of sacral region: Secondary | ICD-10-CM | POA: Diagnosis not present

## 2023-06-20 DIAGNOSIS — M9908 Segmental and somatic dysfunction of rib cage: Secondary | ICD-10-CM

## 2023-06-20 DIAGNOSIS — M9903 Segmental and somatic dysfunction of lumbar region: Secondary | ICD-10-CM

## 2023-06-20 DIAGNOSIS — M9901 Segmental and somatic dysfunction of cervical region: Secondary | ICD-10-CM

## 2023-06-20 NOTE — Patient Instructions (Signed)
 Stretch for 5-7 minutes Especially after grabbing all those rebounds See me in 7-8 weeks

## 2023-06-20 NOTE — Assessment & Plan Note (Signed)
 Right hip pain noted tightness of hip flexor strength noted.  We discussed with patient that icing regimen and home exercises.  Responding well to the neck as well.  Discussed icing regimen.  Increase activity slowly.  Follow-up again in 6 to 8 weeks.

## 2023-08-10 NOTE — Progress Notes (Deleted)
  [name] [address] [place] [address] Phone: [phone] Subjective:    I'm seeing this patient by the request  of:  [name], DO  CC:   [id number]  Brian Lynch is a 63 y.o. male coming in with complaint of back and neck pain. OMT 06/20/2023. Also f/u for R hip pain. Patient states   Medications patient has been prescribed: None  Taking:         Reviewed prior external information including notes and imaging from previsou exam, outside providers and external EMR if available.   As well as notes that were available from care everywhere and other healthcare systems.  Past medical history, social, surgical and family history all reviewed in electronic medical record.  No pertanent information unless stated regarding to the chief complaint.   Past Medical History:  Diagnosis Date   Foot pain    Podiatrist: sesamoiditis and pes planus, enthesopathy of ankle/foot, osteoarthritis ankle/foot    No Known Allergies   Review of Systems:  No headache, visual changes, nausea, vomiting, diarrhea, constipation, dizziness, abdominal pain, skin rash, fevers, chills, night sweats, weight loss, swollen lymph nodes, body aches, joint swelling, chest pain, shortness of breath, mood changes. POSITIVE muscle aches  Objective  There were no vitals taken for this visit.   General: No apparent distress alert and oriented x3 mood and affect normal, dressed appropriately.  HEENT: Pupils equal, extraocular movements intact  Respiratory: Patient's speak in full sentences and does not appear short of breath  Cardiovascular: No lower extremity edema, non tender, no erythema  Gait MSK:  Back   Osteopathic findings  C2 flexed rotated and side bent right C6 flexed rotated and side bent left T3 extended rotated and side bent right inhaled rib T9 extended rotated and side bent left L2 flexed rotated and side bent right Sacrum  right on right       Assessment and Plan:  No problem-specific Assessment & Plan notes found for this encounter.    Nonallopathic problems  Decision today to treat with OMT was based on Physical Exam  After verbal consent patient was treated with HVLA, ME, FPR techniques in cervical, rib, thoracic, lumbar, and sacral  areas  Patient tolerated the procedure well with improvement in symptoms  Patient given exercises, stretches and lifestyle modifications  See medications in patient instructions if given  Patient will follow up in 4-8 weeks             Note: This dictation was prepared with Dragon dictation along with smaller phrase technology. Any transcriptional errors that result from this process are unintentional.

## 2023-08-21 ENCOUNTER — Ambulatory Visit: Payer: Commercial Managed Care - PPO | Admitting: Family Medicine

## 2023-08-22 ENCOUNTER — Other Ambulatory Visit (HOSPITAL_COMMUNITY): Payer: Self-pay

## 2023-08-22 MED ORDER — AZITHROMYCIN 250 MG PO TABS
ORAL_TABLET | ORAL | 0 refills | Status: AC
  Filled 2023-08-22: qty 6, 5d supply, fill #0

## 2023-08-22 MED ORDER — METHYLPREDNISOLONE 4 MG PO TBPK
ORAL_TABLET | ORAL | 0 refills | Status: AC
  Filled 2023-08-22: qty 21, 6d supply, fill #0

## 2024-02-14 ENCOUNTER — Other Ambulatory Visit (HOSPITAL_COMMUNITY): Payer: Self-pay

## 2024-02-14 MED ORDER — FLUZONE 0.5 ML IM SUSY
0.5000 mL | PREFILLED_SYRINGE | Freq: Once | INTRAMUSCULAR | 0 refills | Status: AC
  Filled 2024-02-14: qty 0.5, 1d supply, fill #0

## 2024-04-22 ENCOUNTER — Other Ambulatory Visit: Payer: Self-pay

## 2024-04-22 DIAGNOSIS — M542 Cervicalgia: Secondary | ICD-10-CM

## 2024-04-24 ENCOUNTER — Ambulatory Visit: Admitting: Family Medicine

## 2024-04-24 ENCOUNTER — Other Ambulatory Visit (HOSPITAL_COMMUNITY): Payer: Self-pay

## 2024-04-24 ENCOUNTER — Ambulatory Visit

## 2024-04-24 VITALS — BP 116/74 | HR 65 | Ht 72.0 in | Wt 191.0 lb

## 2024-04-24 DIAGNOSIS — M542 Cervicalgia: Secondary | ICD-10-CM

## 2024-04-24 MED ORDER — PREDNISONE 20 MG PO TABS
40.0000 mg | ORAL_TABLET | Freq: Every day | ORAL | 0 refills | Status: AC
  Filled 2024-04-24: qty 10, 5d supply, fill #0

## 2024-04-24 MED ORDER — GABAPENTIN 100 MG PO CAPS
200.0000 mg | ORAL_CAPSULE | Freq: Every day | ORAL | 0 refills | Status: AC
  Filled 2024-04-24: qty 180, 90d supply, fill #0

## 2024-04-24 MED ORDER — VITAMIN D (ERGOCALCIFEROL) 1.25 MG (50000 UNIT) PO CAPS
50000.0000 [IU] | ORAL_CAPSULE | ORAL | 0 refills | Status: AC
  Filled 2024-04-24: qty 12, 84d supply, fill #0

## 2024-04-24 NOTE — Progress Notes (Signed)
 " [facility] [address] [place] [address] Phone: [phone] Subjective:   Brian Lynch, am serving as a scribe for Dr. Arthea Claudene.  I'm seeing this patient by the request  of:  [name], DO  CC: neck pain   YEP:Dlagzrupcz  Brian Lynch is a 63 y.o. male coming in with complaint of neck pain. Whiplash kind of injury playing basketball, about 2 weeks ago. Has gotten better. Pain with ROM especially quick and forward. Sleeping is okay. Did take muscle relaxer for a night or two.      Past Medical History:  Diagnosis Date   Foot pain    Podiatrist: sesamoiditis and pes planus, enthesopathy of ankle/foot, osteoarthritis ankle/foot   Past Surgical History:  Procedure Laterality Date   COLONOSCOPY  08/03/10   Normal (small internal hemorrhoids).  Repeat in 10 yrs.   Social History   Socioeconomic History   Marital status: Married    Spouse name: Harlene   Number of children: 3   Years of education: Not on file   Highest education level: Not on file  Occupational History   Occupation: Clinical Research Associate  Tobacco Use   Smoking status: Never   Smokeless tobacco: Never  Vaping Use   Vaping status: Never Used  Substance and Sexual Activity   Alcohol use: Yes    Alcohol/week: 5.0 standard drinks of alcohol    Types: 5 Standard drinks or equivalent per week   Drug use: No   Sexual activity: Not on file  Other Topics Concern   Not on file  Social History Narrative   Married, 3 children.   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   No Tob, alc, or drugs.   Social Drivers of Health   Tobacco Use: Low Risk (06/20/2023)   Patient History    Smoking Tobacco Use: Never    Smokeless Tobacco Use: Never    Passive Exposure: Not on file  Financial Resource Strain: Not on file  Food Insecurity: Not on file  Transportation Needs: Not on file  Physical Activity: Not on file  Stress: Not on file  Social Connections: Not on  file  Depression (PHQ2-9): Low Risk (01/23/2023)   Depression (PHQ2-9)    PHQ-2 Score: 0  Alcohol Screen: Not on file  Housing: Not on file  Utilities: Not on file  Health Literacy: Not on file   Allergies[1] Family History  Problem Relation Age of Onset   Atrial fibrillation Mother    Heart failure Mother    COPD Mother    Glaucoma Mother    Colon polyps Father    Stroke Father    Aneurysm Father        AAA   Colon cancer Neg Hx    Esophageal cancer Neg Hx    Rectal cancer Neg Hx    Stomach cancer Neg Hx     Current Outpatient Medications (Endocrine & Metabolic):    predniSONE  (DELTASONE ) 20 MG tablet, Take 2 tablets (40 mg total) by mouth daily with breakfast.   methylPREDNISolone  (MEDROL  DOSEPAK) 4 MG TBPK tablet, take as directed  Current Outpatient Medications (Analgesics):    aspirin EC 81 MG tablet, Take 81 mg by mouth daily. Swallow whole.   meloxicam  (MOBIC ) 15 MG tablet, Take 1 tablet (15 mg total) by mouth daily.  Current Outpatient Medications (Other):    gabapentin  (NEURONTIN ) 100 MG capsule, Take 2 capsules (200 mg total) by mouth at bedtime.  Vitamin D , Ergocalciferol , (DRISDOL ) 1.25 MG (50000 UNIT) CAPS capsule, Take 1 capsule (50,000 Units total) by mouth every 7 (seven) days.   azithromycin  (ZITHROMAX ) 250 MG tablet, Take 2 tablets by mouth on day one, then 1 tablet daily for 4 days   Multiple Vitamin (MULTIVITAMIN) tablet, Take 1 tablet by mouth daily.   Omega-3 Fatty Acids (FISH OIL) 1000 MG CAPS, Take 1 capsule by mouth daily.   Probiotic Product (PROBIOTIC DAILY PO), Take by mouth.   tamsulosin  (FLOMAX ) 0.4 MG CAPS capsule, Take 1 capsule (0.4 mg total) by mouth daily.   Reviewed prior external information including notes and imaging from  primary care provider As well as notes that were available from care everywhere and other healthcare systems.  Past medical history, social, surgical and family history all reviewed in electronic medical record.   No pertanent information unless stated regarding to the chief complaint.   Review of Systems:  No headache, visual changes, nausea, vomiting, diarrhea, constipation, dizziness, abdominal pain, skin rash, fevers, chills, night sweats, weight loss, swollen lymph nodes, body aches, joint swelling, chest pain, shortness of breath, mood changes. POSITIVE muscle aches  Objective  Blood pressure 116/74, pulse 65, height 6' (1.829 m), weight 191 lb (86.6 kg), SpO2 97%.   General: No apparent distress alert and oriented x3 mood and affect normal, dressed appropriately.  HEENT: Pupils equal, extraocular movements intact  Respiratory: Patient's speak in full sentences and does not appear short of breath  Cardiovascular: No lower extremity edema, non tender, no erythema  Neck exam shows patient does have some loss of lordosis noted.  Some tenderness to palpation of the paraspinal musculature.  Patient does have unfortunately weakness noted in the C8 distribution on the right side and patient is a right-hand-dominant individual.  No hypothenar eminence wasting noted though.    Impression and Recommendations:    The above documentation has been reviewed and is accurate and complete Oneill Bais M Tobey Schmelzle, DO        [1] No Known Allergies  "

## 2024-04-24 NOTE — Patient Instructions (Addendum)
 Gabapentin  200mg  Vit D prescription Prednisone  40mg  for 5 days K2 200mcg daily 4 weeks Do prescribed exercises at least 3x a week See you again in 2-3 weeks (okay to double)

## 2024-04-24 NOTE — Assessment & Plan Note (Signed)
 Patient has had a neck injury previously as well as neck pain.  Known arthritic changes but significant progression on the x-rays.  Also a potential concern for an anterior compression fracture.  Patient does have some weakness noted in the C8 distribution on the right side as well.  Some difficulty with range of motion.  We discussed different treatment options.  Once weekly vitamin D , gabapentin  given as well.  Discussed that this may take some time to completely heal.  We discussed icing regimen.  Follow-up again in 2 weeks to make sure stronger.  If not we will need to consider advanced imaging.  Held on any type of manipulation today.

## 2024-05-07 NOTE — Progress Notes (Unsigned)
 " [facility] [address] [place] [address] Phone: [phone] Subjective:   [name], am serving as a scribe for Dr. Arthea Claudene.  I'm seeing this patient by the request  of:  [name], DO  CC: Neck pain follow-up  YEP:Dlagzrupcz  04/24/2024 Patient has had a neck injury previously as well as neck pain.  Known arthritic changes but significant progression on the x-rays.  Also a potential concern for an anterior compression fracture.  Patient does have some weakness noted in the C8 distribution on the right side as well.  Some difficulty with range of motion.  We discussed different treatment options.  Once weekly vitamin D , gabapentin  given as well.  Discussed that this may take some time to completely heal.  We discussed icing regimen.  Follow-up again in 2 weeks to make sure stronger.  If not we will need to consider advanced imaging.  Held on any type of manipulation today.      Update 05/08/2024 Brian Lynch is a 64 y.o. male coming in with complaint of cervical spine pain.  Was found to have cervical radiculopathy and unfortunately did have some weakness noted.  Patient was to start gabapentin  for the C8 distribution.  Patient states that he is sore in the cspine.       Past Medical History:  Diagnosis Date   Foot pain    Podiatrist: sesamoiditis and pes planus, enthesopathy of ankle/foot, osteoarthritis ankle/foot   Past Surgical History:  Procedure Laterality Date   COLONOSCOPY  08/03/10   Normal (small internal hemorrhoids).  Repeat in 10 yrs.   Social History   Socioeconomic History   Marital status: Married    Spouse name: Brian Lynch   Number of children: 3   Years of education: Not on file   Highest education level: Not on file  Occupational History   Occupation: Clinical Research Associate  Tobacco Use   Smoking status: Never   Smokeless tobacco: Never  Vaping Use   Vaping status: Never Used  Substance and Sexual  Activity   Alcohol use: Yes    Alcohol/week: 5.0 standard drinks of alcohol    Types: 5 Standard drinks or equivalent per week   Drug use: No   Sexual activity: Not on file  Other Topics Concern   Not on file  Social History Narrative   Married, 3 children.   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   No Tob, alc, or drugs.   Social Drivers of Health   Tobacco Use: Low Risk (06/20/2023)   Patient History    Smoking Tobacco Use: Never    Smokeless Tobacco Use: Never    Passive Exposure: Not on file  Financial Resource Strain: Not on file  Food Insecurity: Not on file  Transportation Needs: Not on file  Physical Activity: Not on file  Stress: Not on file  Social Connections: Not on file  Depression (PHQ2-9): Low Risk (01/23/2023)   Depression (PHQ2-9)    PHQ-2 Score: 0  Alcohol Screen: Not on file  Housing: Not on file  Utilities: Not on file  Health Literacy: Not on file   Allergies[1] Family History  Problem Relation Age of Onset   Atrial fibrillation Mother    Heart failure Mother    COPD Mother    Glaucoma Mother    Colon polyps Father    Stroke Father    Aneurysm Father        AAA   Colon  cancer Neg Hx    Esophageal cancer Neg Hx    Rectal cancer Neg Hx    Stomach cancer Neg Hx     Current Outpatient Medications (Endocrine & Metabolic):    methylPREDNISolone  (MEDROL  DOSEPAK) 4 MG TBPK tablet, take as directed   predniSONE  (DELTASONE ) 20 MG tablet, Take 2 tablets (40 mg total) by mouth daily with breakfast.  Current Outpatient Medications (Analgesics):    aspirin EC 81 MG tablet, Take 81 mg by mouth daily. Swallow whole.   meloxicam  (MOBIC ) 15 MG tablet, Take 1 tablet (15 mg total) by mouth daily.  Current Outpatient Medications (Other):    azithromycin  (ZITHROMAX ) 250 MG tablet, Take 2 tablets by mouth on day one, then 1 tablet daily for 4 days   gabapentin  (NEURONTIN ) 100 MG capsule, Take 2 capsules (200 mg total) by mouth at bedtime.   Multiple  Vitamin (MULTIVITAMIN) tablet, Take 1 tablet by mouth daily.   Omega-3 Fatty Acids (FISH OIL) 1000 MG CAPS, Take 1 capsule by mouth daily.   Probiotic Product (PROBIOTIC DAILY PO), Take by mouth.   tamsulosin  (FLOMAX ) 0.4 MG CAPS capsule, Take 1 capsule (0.4 mg total) by mouth daily.   Vitamin D , Ergocalciferol , (DRISDOL ) 1.25 MG (50000 UNIT) CAPS capsule, Take 1 capsule (50,000 Units total) by mouth every 7 (seven) days.     Objective  Blood pressure 124/78, pulse 64, height 6' (1.829 m), weight 195 lb (88.5 kg), SpO2 97%.   General: No apparent distress alert and oriented x3 mood and affect normal, dressed appropriately.  HEENT: Pupils equal, extraocular movements intact  Respiratory: Patient's speak in full sentences and does not appear short of breath  Cardiovascular: No lower extremity edema, non tender, no erythema  Neck exam shows improvement in range of motion.  Patient has some negative Spurling's, improvement in the strength also noted in the C8 distribution.  With certain range of motion though at the end does have some tingling in the C8 distribution bilaterally but again no weakness.    Impression and Recommendations:     The above documentation has been reviewed and is accurate and complete Avien Taha M Shayne Diguglielmo, DO       [1] No Known Allergies  "

## 2024-05-08 ENCOUNTER — Ambulatory Visit: Admitting: Family Medicine

## 2024-05-08 VITALS — BP 124/78 | HR 64 | Ht 72.0 in | Wt 195.0 lb

## 2024-05-08 DIAGNOSIS — M542 Cervicalgia: Secondary | ICD-10-CM

## 2024-05-08 NOTE — Patient Instructions (Signed)
 Scapular exercises Continue Vit D See you again in 3 weeks (Ok to double book)

## 2024-05-08 NOTE — Assessment & Plan Note (Signed)
 Patient is making some progress.  Still has some very mild radicular symptoms in the C8 distribution.  Increase activity slowly.  Continue the once weekly vitamin D .  Given more strengthening exercises and follow-up again in 3 weeks.

## 2024-05-24 NOTE — Progress Notes (Signed)
 " [facility] [address] [place] [address] Phone: [phone] Subjective:   [name], am serving as a scribe for Dr. Arthea Claudene.  I'm seeing this patient by the request  of:  [name], DO  CC: Neck pain follow-up  YEP:Dlagzrupcz  05/08/2024 Patient is making some progress.  Still has some very mild radicular symptoms in the C8 distribution.  Increase activity slowly.  Continue the once weekly vitamin D .  Given more strengthening exercises and follow-up again in 3 weeks.    Update 05/29/2024 Brian Lynch is a 64 y.o. male coming in with complaint of neck pain.  Was found to have neck pain with a component compression fracture.  Was having C8 radiculopathy.  Patient states that his neck is still sore. Does not feel like it has improved. Not limiting. Denies any radiating symptoms.     Past Medical History:  Diagnosis Date   Foot pain    Podiatrist: sesamoiditis and pes planus, enthesopathy of ankle/foot, osteoarthritis ankle/foot   Past Surgical History:  Procedure Laterality Date   COLONOSCOPY  08/03/10   Normal (small internal hemorrhoids).  Repeat in 10 yrs.   Social History   Socioeconomic History   Marital status: Married    Spouse name: Harlene   Number of children: 3   Years of education: Not on file   Highest education level: Not on file  Occupational History   Occupation: Clinical Research Associate  Tobacco Use   Smoking status: Never   Smokeless tobacco: Never  Vaping Use   Vaping status: Never Used  Substance and Sexual Activity   Alcohol use: Yes    Alcohol/week: 5.0 standard drinks of alcohol    Types: 5 Standard drinks or equivalent per week   Drug use: No   Sexual activity: Not on file  Other Topics Concern   Not on file  Social History Narrative   Married, 3 children.   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   No Tob, alc, or drugs.   Social Drivers of Health   Tobacco Use: Low Risk (06/20/2023)    Patient History    Smoking Tobacco Use: Never    Smokeless Tobacco Use: Never    Passive Exposure: Not on file  Financial Resource Strain: Not on file  Food Insecurity: Not on file  Transportation Needs: Not on file  Physical Activity: Not on file  Stress: Not on file  Social Connections: Not on file  Depression (PHQ2-9): Low Risk (01/23/2023)   Depression (PHQ2-9)    PHQ-2 Score: 0  Alcohol Screen: Not on file  Housing: Not on file  Utilities: Not on file  Health Literacy: Not on file   Allergies[1] Family History  Problem Relation Age of Onset   Atrial fibrillation Mother    Heart failure Mother    COPD Mother    Glaucoma Mother    Colon polyps Father    Stroke Father    Aneurysm Father        AAA   Colon cancer Neg Hx    Esophageal cancer Neg Hx    Rectal cancer Neg Hx    Stomach cancer Neg Hx     Current Outpatient Medications (Endocrine & Metabolic):    methylPREDNISolone  (MEDROL  DOSEPAK) 4 MG TBPK tablet, take as directed   predniSONE  (DELTASONE ) 20 MG tablet, Take 2 tablets (40 mg total) by mouth daily with breakfast.  Current Outpatient Medications (Analgesics):    aspirin EC 81  MG tablet, Take 81 mg by mouth daily. Swallow whole.   meloxicam  (MOBIC ) 15 MG tablet, Take 1 tablet (15 mg total) by mouth daily.  Current Outpatient Medications (Other):    azithromycin  (ZITHROMAX ) 250 MG tablet, Take 2 tablets by mouth on day one, then 1 tablet daily for 4 days   gabapentin  (NEURONTIN ) 100 MG capsule, Take 2 capsules (200 mg total) by mouth at bedtime.   Multiple Vitamin (MULTIVITAMIN) tablet, Take 1 tablet by mouth daily.   Omega-3 Fatty Acids (FISH OIL) 1000 MG CAPS, Take 1 capsule by mouth daily.   Probiotic Product (PROBIOTIC DAILY PO), Take by mouth.   tamsulosin  (FLOMAX ) 0.4 MG CAPS capsule, Take 1 capsule (0.4 mg total) by mouth daily.   Vitamin D , Ergocalciferol , (DRISDOL ) 1.25 MG (50000 UNIT) CAPS capsule, Take 1 capsule (50,000 Units total) by mouth  every 7 (seven) days.   Reviewed prior external information including notes and imaging from  primary care provider As well as notes that were available from care everywhere and other healthcare systems.  Past medical history, social, surgical and family history all reviewed in electronic medical record.  No pertanent information unless stated regarding to the chief complaint.   Review of Systems:  No headache, visual changes, nausea, vomiting, diarrhea, constipation, dizziness, abdominal pain, skin rash, fevers, chills, night sweats, weight loss, swollen lymph nodes, body aches, joint swelling, chest pain, shortness of breath, mood changes. POSITIVE muscle aches  Objective  Blood pressure 112/82, pulse 83, height 6' (1.829 m), weight 195 lb (88.5 kg), SpO2 97%.   General: No apparent distress alert and oriented x3 mood and affect normal, dressed appropriately.  HEENT: Pupils equal, extraocular movements intact  Respiratory: Patient's speak in full sentences and does not appear short of breath  Cardiovascular: No lower extremity edema, non tender, no erythema  Neck exam shows mild loss of lordosis.  Improvement in range of motion but still lacking the last 10 degrees of extension.  Negative Spurling's and 5 out of 5 strength of the upper extremities    Impression and Recommendations:     The above documentation has been reviewed and is accurate and complete Toluwani Yadav M Tamakia Porto, DO       [1] No Known Allergies  "

## 2024-05-29 ENCOUNTER — Other Ambulatory Visit: Payer: Self-pay

## 2024-05-29 ENCOUNTER — Ambulatory Visit (INDEPENDENT_AMBULATORY_CARE_PROVIDER_SITE_OTHER): Admitting: Family Medicine

## 2024-05-29 VITALS — BP 112/82 | HR 83 | Ht 72.0 in | Wt 195.0 lb

## 2024-05-29 DIAGNOSIS — M542 Cervicalgia: Secondary | ICD-10-CM | POA: Diagnosis not present

## 2024-05-29 NOTE — Assessment & Plan Note (Addendum)
 Patient is making good strides at this time.  Discussed with patient about icing regimen and home exercises.  In the avoided the patient daily on week of L-spine but will start to consider the possibility of more manipulation and follow-up.  Given progression to start to increase activity on a more regular basis.  Follow-up again in 6 to 8 weeks did discuss advanced imaging but this would not change medical management with patient making improvement.  We did give a return to play progression.

## 2024-07-22 ENCOUNTER — Ambulatory Visit: Admitting: Family Medicine
